# Patient Record
Sex: Female | Born: 1945 | Race: White | Hispanic: No | Marital: Married | Smoking: Never smoker
Health system: Southern US, Community
[De-identification: ages and names within clinical notes are randomized; demographics above are authoritative.]

## PROBLEM LIST (undated history)

## (undated) DIAGNOSIS — I639 Cerebral infarction, unspecified: Secondary | ICD-10-CM

---

## 2018-12-05 ENCOUNTER — Other Ambulatory Visit: Payer: Self-pay

## 2018-12-05 ENCOUNTER — Emergency Department (HOSPITAL_BASED_OUTPATIENT_CLINIC_OR_DEPARTMENT_OTHER): Payer: Medicare Other

## 2018-12-05 ENCOUNTER — Encounter (HOSPITAL_BASED_OUTPATIENT_CLINIC_OR_DEPARTMENT_OTHER): Payer: Self-pay | Admitting: Emergency Medicine

## 2018-12-05 ENCOUNTER — Inpatient Hospital Stay (HOSPITAL_BASED_OUTPATIENT_CLINIC_OR_DEPARTMENT_OTHER)
Admission: EM | Admit: 2018-12-05 | Discharge: 2018-12-07 | DRG: 065 | Disposition: A | Discharge status: Home / Self Care | Payer: Medicare Other | Attending: Internal Medicine | Admitting: Internal Medicine

## 2018-12-05 ENCOUNTER — Inpatient Hospital Stay (HOSPITAL_COMMUNITY): Payer: Medicare Other

## 2018-12-05 DIAGNOSIS — E119 Type 2 diabetes mellitus without complications: Secondary | ICD-10-CM

## 2018-12-05 DIAGNOSIS — E876 Hypokalemia: Secondary | ICD-10-CM | POA: Diagnosis present

## 2018-12-05 DIAGNOSIS — Z7982 Long term (current) use of aspirin: Secondary | ICD-10-CM | POA: Diagnosis not present

## 2018-12-05 DIAGNOSIS — I10 Essential (primary) hypertension: Secondary | ICD-10-CM | POA: Diagnosis present

## 2018-12-05 DIAGNOSIS — I672 Cerebral atherosclerosis: Secondary | ICD-10-CM | POA: Diagnosis present

## 2018-12-05 DIAGNOSIS — I160 Hypertensive urgency: Secondary | ICD-10-CM | POA: Diagnosis present

## 2018-12-05 DIAGNOSIS — E785 Hyperlipidemia, unspecified: Secondary | ICD-10-CM | POA: Diagnosis present

## 2018-12-05 DIAGNOSIS — R270 Ataxia, unspecified: Secondary | ICD-10-CM | POA: Diagnosis present

## 2018-12-05 DIAGNOSIS — N179 Acute kidney failure, unspecified: Secondary | ICD-10-CM | POA: Diagnosis present

## 2018-12-05 DIAGNOSIS — E1165 Type 2 diabetes mellitus with hyperglycemia: Secondary | ICD-10-CM | POA: Diagnosis present

## 2018-12-05 DIAGNOSIS — Z7902 Long term (current) use of antithrombotics/antiplatelets: Secondary | ICD-10-CM | POA: Diagnosis not present

## 2018-12-05 DIAGNOSIS — E0800 Diabetes mellitus due to underlying condition with hyperosmolarity without nonketotic hyperglycemic-hyperosmolar coma (NKHHC): Secondary | ICD-10-CM

## 2018-12-05 DIAGNOSIS — Z823 Family history of stroke: Secondary | ICD-10-CM | POA: Diagnosis not present

## 2018-12-05 DIAGNOSIS — I361 Nonrheumatic tricuspid (valve) insufficiency: Secondary | ICD-10-CM | POA: Diagnosis not present

## 2018-12-05 DIAGNOSIS — I639 Cerebral infarction, unspecified: Principal | ICD-10-CM | POA: Diagnosis present

## 2018-12-05 DIAGNOSIS — N19 Unspecified kidney failure: Secondary | ICD-10-CM | POA: Diagnosis not present

## 2018-12-05 DIAGNOSIS — G8191 Hemiplegia, unspecified affecting right dominant side: Secondary | ICD-10-CM | POA: Diagnosis present

## 2018-12-05 DIAGNOSIS — I161 Hypertensive emergency: Secondary | ICD-10-CM

## 2018-12-05 DIAGNOSIS — Z79899 Other long term (current) drug therapy: Secondary | ICD-10-CM | POA: Diagnosis not present

## 2018-12-05 DIAGNOSIS — I340 Nonrheumatic mitral (valve) insufficiency: Secondary | ICD-10-CM | POA: Diagnosis not present

## 2018-12-05 DIAGNOSIS — R9082 White matter disease, unspecified: Secondary | ICD-10-CM | POA: Diagnosis present

## 2018-12-05 HISTORY — DX: Cerebral infarction, unspecified: I63.9

## 2018-12-05 LAB — CBC
HCT: 42.1 % (ref 36.0–46.0)
Hemoglobin: 13.2 g/dL (ref 12.0–15.0)
MCH: 28.3 pg (ref 26.0–34.0)
MCHC: 31.4 g/dL (ref 30.0–36.0)
MCV: 90.1 fL (ref 80.0–100.0)
Platelets: 271 10*3/uL (ref 150–400)
RBC: 4.67 MIL/uL (ref 3.87–5.11)
RDW: 13.2 % (ref 11.5–15.5)
WBC: 7.4 10*3/uL (ref 4.0–10.5)
nRBC: 0 % (ref 0.0–0.2)

## 2018-12-05 LAB — COMPREHENSIVE METABOLIC PANEL
ALT: 16 U/L (ref 0–44)
AST: 19 U/L (ref 15–41)
Albumin: 4.4 g/dL (ref 3.5–5.0)
Alkaline Phosphatase: 71 U/L (ref 38–126)
Anion gap: 10 (ref 5–15)
BUN: 28 mg/dL — ABNORMAL HIGH (ref 8–23)
CHLORIDE: 107 mmol/L (ref 98–111)
CO2: 21 mmol/L — ABNORMAL LOW (ref 22–32)
Calcium: 9.7 mg/dL (ref 8.9–10.3)
Creatinine, Ser: 1.36 mg/dL — ABNORMAL HIGH (ref 0.44–1.00)
GFR calc Af Amer: 46 mL/min — ABNORMAL LOW (ref >60)
GFR calc non Af Amer: 39 mL/min — ABNORMAL LOW (ref >60)
Glucose, Bld: 182 mg/dL — ABNORMAL HIGH (ref 70–99)
POTASSIUM: 3.3 mmol/L — AB (ref 3.5–5.1)
Sodium: 138 mmol/L (ref 135–145)
Total Bilirubin: 0.7 mg/dL (ref 0.3–1.2)
Total Protein: 8.2 g/dL — ABNORMAL HIGH (ref 6.5–8.1)

## 2018-12-05 LAB — DIFFERENTIAL
Abs Immature Granulocytes: 0.01 10*3/uL (ref 0.00–0.07)
BASOS PCT: 0 %
Basophils Absolute: 0 10*3/uL (ref 0.0–0.1)
EOS ABS: 0 10*3/uL (ref 0.0–0.5)
EOS PCT: 0 %
Immature Granulocytes: 0 %
Lymphocytes Relative: 29 %
Lymphs Abs: 2.1 10*3/uL (ref 0.7–4.0)
Monocytes Absolute: 0.4 10*3/uL (ref 0.1–1.0)
Monocytes Relative: 5 %
Neutro Abs: 4.8 10*3/uL (ref 1.7–7.7)
Neutrophils Relative %: 66 %

## 2018-12-05 LAB — PROTIME-INR
INR: 1 (ref 0.8–1.2)
Prothrombin Time: 13.1 seconds (ref 11.4–15.2)

## 2018-12-05 LAB — APTT: aPTT: 28 seconds (ref 24–36)

## 2018-12-05 LAB — CBG MONITORING, ED: Glucose-Capillary: 146 mg/dL — ABNORMAL HIGH (ref 70–99)

## 2018-12-05 MED ORDER — SENNA 8.6 MG PO TABS
1.0000 | ORAL_TABLET | Freq: Two times a day (BID) | ORAL | Status: DC
  Administered 2018-12-05 – 2018-12-07 (×4): 8.6 mg via ORAL
  Filled 2018-12-05 (×4): qty 1

## 2018-12-05 MED ORDER — ASPIRIN 325 MG PO TABS
325.0000 mg | ORAL_TABLET | Freq: Once | ORAL | Status: AC
  Administered 2018-12-05: 325 mg via ORAL
  Filled 2018-12-05: qty 1

## 2018-12-05 MED ORDER — ASPIRIN EC 81 MG PO TBEC
81.0000 mg | DELAYED_RELEASE_TABLET | Freq: Every day | ORAL | Status: DC
  Administered 2018-12-06 – 2018-12-07 (×2): 81 mg via ORAL
  Filled 2018-12-05 (×3): qty 1

## 2018-12-05 MED ORDER — HEPARIN SODIUM (PORCINE) 5000 UNIT/ML IJ SOLN
5000.0000 [IU] | Freq: Three times a day (TID) | INTRAMUSCULAR | Status: DC
  Administered 2018-12-05 – 2018-12-07 (×6): 5000 [IU] via SUBCUTANEOUS
  Filled 2018-12-05 (×6): qty 1

## 2018-12-05 MED ORDER — ATORVASTATIN CALCIUM 80 MG PO TABS
80.0000 mg | ORAL_TABLET | Freq: Every day | ORAL | Status: DC
  Administered 2018-12-05 – 2018-12-06 (×2): 80 mg via ORAL
  Filled 2018-12-05 (×2): qty 1

## 2018-12-05 MED ORDER — LORAZEPAM 2 MG/ML IJ SOLN
0.5000 mg | Freq: Once | INTRAMUSCULAR | Status: AC | PRN
  Administered 2018-12-05: 0.5 mg via INTRAVENOUS
  Filled 2018-12-05: qty 1

## 2018-12-05 MED ORDER — POTASSIUM CHLORIDE CRYS ER 20 MEQ PO TBCR
40.0000 meq | EXTENDED_RELEASE_TABLET | Freq: Once | ORAL | Status: AC
  Administered 2018-12-05: 40 meq via ORAL
  Filled 2018-12-05: qty 2

## 2018-12-05 MED ORDER — IOPAMIDOL (ISOVUE-370) INJECTION 76%
100.0000 mL | Freq: Once | INTRAVENOUS | Status: AC | PRN
  Administered 2018-12-05: 80 mL via INTRAVENOUS

## 2018-12-05 MED ORDER — LABETALOL HCL 5 MG/ML IV SOLN
10.0000 mg | INTRAVENOUS | Status: DC | PRN
  Administered 2018-12-05 – 2018-12-07 (×5): 10 mg via INTRAVENOUS
  Filled 2018-12-05 (×6): qty 4

## 2018-12-05 MED ORDER — ACETAMINOPHEN 650 MG RE SUPP: 650.0000 mg | Freq: Four times a day (QID) | RECTAL | Status: DC | PRN

## 2018-12-05 MED ORDER — LABETALOL HCL 5 MG/ML IV SOLN
10.0000 mg | Freq: Once | INTRAVENOUS | Status: AC
  Administered 2018-12-05: 10 mg via INTRAVENOUS
  Filled 2018-12-05: qty 4

## 2018-12-05 MED ORDER — LORAZEPAM 2 MG/ML IJ SOLN
0.5000 mg | Freq: Once | INTRAMUSCULAR | Status: AC
  Administered 2018-12-05: 0.5 mg via INTRAVENOUS
  Filled 2018-12-05: qty 1

## 2018-12-05 MED ORDER — ACETAMINOPHEN 325 MG PO TABS: 650.0000 mg | ORAL_TABLET | Freq: Four times a day (QID) | ORAL | Status: DC | PRN

## 2018-12-05 MED ORDER — SODIUM CHLORIDE 0.9% FLUSH
3.0000 mL | Freq: Once | INTRAVENOUS | Status: DC
Start: 2018-12-05 — End: 2018-12-07
  Filled 2018-12-05: qty 3

## 2018-12-05 NOTE — ED Notes (Signed)
Pt states woke at 0444m this am w some numbness and tingling in hands and feet,  Then when she got up at 0600 had some dizziness, very anxious,  Denies pain  Last seen normal at 2200 last pm  Pt a/o,  No slurred speech,  No facial droop

## 2018-12-05 NOTE — H&P (Signed)
Date: 12/05/2018               Patient Name:  Christine Glass MRN: [id number]  DOB: 1946/02/29 Age / Sex: 73 y.o., female   PCP: Patient, No Pcp Per         Medical Service: Internal Medicine Teaching Service         Attending Physician: Dr. Sandre Kitty Elwin Mocha, MD    First Contact: Dr. Judeth Cornfield Pager: [phone]  Second Contact: Dr. Ginette Otto Pager: [phone]       After Hours (After 5p/  First Contact Pager: [phone]  weekends / holidays): Second Contact Pager: [phone]   Chief Complaint: Right sided numbness  History of Present Illness:  Christine Glass is a 73 yo F w/ no significant PMH of presenting with right upper extremity numbness and right lower extremity weakness. She was examined and evaluated at bedside with husband present. She was observed resting comfortably in bed. She states she was in her usual state of health until around 4am this morning when she woke up and noticed a numbness of her right arm described as 'dull feeling' as well as right foot weakness. She states intially she thought the sensation would pass but her symptoms persisted and after discussion with her husband she went to the [facility] ED for evaluation. She was told that she would be transferred to [facility]. Her right foot weakness has resolved but she is continuing to endorse numbness of her right arm. She is right-hand dominant and was able to use her right arm for signing signature on paperwork without difficulty. She denies any other numbness, tingling, burning, headache, blurry vision, dizziness or light-headedness. Denies any chest pain, palpitations, dyspnea, nausea, vomiting.  In the ED, she was found to have CT head wo contrast w/ hypoattenuation in left frontal lobe but no bleed. CTA head and neck was performed in the ED showing intracranial atherosclerosis without occlusion and age-indeterminate left thalamic infarct. Neurology was consulted who recommended admission to  [facility] with MRI. IM was consulted for admission.  Meds:  Current Meds  Medication Sig  . aspirin 81 MG chewable tablet Chew 81 mg by mouth daily.   Allergies: Allergies as of 12/05/2018  . (No Known Allergies)   History reviewed. No pertinent past medical history.  Family History:  Grandfather had stroke in his 22s. No other significant family history that she is aware of.  Social History:  Used to work in office as HR rep. Children lives close by. Currently living with husband. Denies any alcohol, tobacco, illicit substance use.  Review of Systems: A complete ROS was negative except as per HPI.   Physical Exam: Blood pressure (!) 205/126, pulse 92, temperature 98.3 F (36.8 C), temperature source Oral, resp. rate 17, height 5\' 1"  (1.549 m), weight 77.1 kg, SpO2 99 %. Physical Exam  Constitutional: She is well-developed, well-nourished, and in no distress. No distress.  HENT:  Head: Normocephalic and atraumatic.  Mouth/Throat: Oropharynx is clear and moist.  Eyes: Pupils are equal, round, and reactive to light. Conjunctivae and EOM are normal.  Neck: Normal range of motion. Neck supple. No tracheal deviation present. No thyromegaly present.  Cardiovascular: Regular rhythm, normal heart sounds and intact distal pulses.  No murmur heard. Tachycardic  Pulmonary/Chest: Effort normal and breath sounds normal. She has no wheezes. She has no rales.  Abdominal: Soft. Bowel sounds are normal. There is no abdominal tenderness. There is no guarding.  Musculoskeletal:  Normal range of motion.        General: No edema.  Neurological:  Neurologic exam: Mental status: A&Ox3 Cranial Nerves: II: PERRL III, IV, VI: Extra-occular motions intact bilaterally V, VII: Face symmetric, sensation intact in all 3 divisions  VIII: hearing normal to rubbing fingers bilaterally  IX, X: palate rises symmetrically XI: Head  turn and shoulder shrug normal bilaterally  XII: tongue midline  Motor: Strength 4/5 on RUE, 5/5 on RLE, 5/5 on LUE, 5/5 on LLE, bulk muscle and tone are normal  Sensory: Light touch intact and symmetric bilaterally but endorsing subjective numbness of right upper extremity.  Coordination: There is no dysmetria on finger-to-nose. Rapid alternating movement test normal. Psychiatric: Normal mood and affect  Skin: She is not diaphoretic.   EKG: personally reviewed my interpretation is sinus tachycardia, normal axis, no ST changes, prolonged QTc.  CXR: personally reviewed my interpretation is N/A  Assessment & Plan by Problem: Active Problems:   Acute CVA (cerebrovascular accident) [facility])  Christine Glass is a 73 yo F w/ no significant PMH presenting with right sided numbness most likely 2/2 CVA. MRI is pending for confirmation on chronicity of left thalamic infract found on CTA. Neurology on board. Admit for stroke work up.  Right sided numbness/weakness 2/2 CVA CT head wo contrast without hemorrhage or mass. CTA show no emergent large vessel occlusion. Intracranial atherosclerosis involving PCAs. Left thalamic infarct of indeterminate age. MRI pending - Allow for permissive HTN in the setting of CVA (systolic < 220 and diastolic < 120)   - ASA 325 mg once/ 81 mg daily  - Atorvastatin 80mg  - Echocardiogram  - A1C, Lipid panel  - Telemetry - PT/OT/SLP eval and treat  Severe uncontrolled hypertension BP on admission 240/130. Consistently high since admission. Current bp 220/132. HR 97. Not endorsing headache, chest pain, dyspnea, blurry vision. - Allowing permissive hypertension up to 220 systolic in setting of stroke - Labetalol 10mg  q2hr PRN for systolic BP >220, diastolic >120  Acute Kidney Injury Admit creatinine 1.36. Unclear baseline. Most likely 2/2 dehydration from no significant fluid intake since last night. - Trend BMP  Hypokalemia Admit potassium 3.3. Not on any  potassium wasting meds. - Magnesium level - K-dur once after passing speech and swallow  DVT prophx: subqheparin Diet: Npo til speech and swallow Bowel: Senokot Code: Full  Dispo: Admit patient to Inpatient with expected length of stay greater than 2 midnights.  Signed: Theotis Barrio, MD 12/05/2018, 12:39 PM  Pager: [phone]

## 2018-12-05 NOTE — Consult Note (Addendum)
Neurology Consultation  Reason for Consult: Right-sided numbness Referring Physician: Dr. Juleen China  CC: Right-sided numbness and weakness  History is obtained from: Patient, chart, patient's husband at bedside  HPI: Christine Glass is a 73 y.o. female with no significant past medical history as she has not seen a doctor in many years, presents to the emergency room for evaluation for strokelike symptoms. She initially presented to the emergency department at [facility] complaining of numbness in the right arm and leg when she woke up this morning at 4 AM.  Her last known normal was 9 PM last night when she went to bed. She denies any headaches or visual symptoms.  Reports right arm and leg numbness and heaviness. Denies any frank weakness.  Has not had similar symptoms in the past.  Currently on no medications. Denies any preceding nausea vomiting.  Denies any fevers chills.  Denies any chest pain palpitations.  Denies shortness of breath cough.  Denies abdominal pain nausea vomiting.  Denies bleeding or clotting tendencies.  Noncontrast head CT at [facility] unremarkable. I received a call about this patient for med [facility] for recommendations as she was outside the window for any acute intervention and telemedicine neurology was not called.  I recommended a CTA head and neck and transferred to [facility] for MRI and further evaluation. CTA head and neck unremarkable for LVO Of note, she was extremely hypertensive on arrival with systolic blood pressures as high as high 250s.  LKW: 9 PM on 12/04/2018 tpa given?: no, outside the window Premorbid modified Rankin scale (mRS): 0  ROS: ROS was performed and is negative except as noted in the HPI.   History reviewed. No pertinent past medical history. None  No family history on file. None  Social History:   reports that she has never smoked. She has never used smokeless tobacco. She reports that she  does not drink alcohol or use drugs.  Does not abuse alcohol.  No tobacco use.  No illicit drug use.  Medications  Current Facility-Administered Medications:  .  sodium chloride flush (NS) 0.9 % injection 3 mL, 3 mL, Intravenous, Once, Tilden Fossa, MD  Current Outpatient Medications:  .  aspirin 81 MG chewable tablet, Chew 81 mg by mouth daily., Disp: , Rfl:   Exam: Current vital signs: BP (!) 230/99   Pulse 97   Temp 98.3 F (36.8 C) (Oral)   Resp 16   Ht 5\' 1"  (1.549 m)   Wt 77.1 kg   SpO2 97%   BMI 32.12 kg/m  Vital signs in last 24 hours: Temp:  [98.3 F (36.8 C)] 98.3 F (36.8 C) (02/26 0801) Pulse Rate:  [76-121] 97 (02/26 1015) Resp:  [16-24] 16 (02/26 1015) BP: (201-252)/(99-217) 230/99 (02/26 1005) SpO2:  [85 %-100 %] 97 % (02/26 1015) Weight:  [77.1 kg] 77.1 kg (02/26 0755) General: Awake alert in no distress sitting comfortably in bed. HEENT: Normocephalic atraumatic dry oral mucous membranes with no lymphadenopathy and the neck and no thyromegaly. CVS: Regular rate rhythm Respiratory: Breathing comfortably and saturating normally on room air. Extremities: Warm well perfused no edema Neurological exam She is awake alert oriented x3. Her speech is not dysarthric. Naming comprehension and repetition is intact. Cranial nerves: Pupils equal round reactive to light, extraocular movements intact, visual fields full, facial sensation intact to light touch bilaterally, face is symmetric, auditory acuity is grossly intact, shoulder shrug intact, tongue midline.  Palate midline. Motor  exam no vertical drift noted on any of the 4 extremities but she has a mild pronator drift on the right arm.  Individual muscle testing noted very slight weakness of the right upper extremity 4+/5 at the biceps.  Lower extremities antigravity without drift for 5 seconds.  No focal weakness in the lower extremities. Sensory exam: Decreased light touch on the right arm and leg compared to  the left. Coordination: Mild ataxia on right finger-nose-finger discordant with the weakness.  No ataxia in the lower limbs.  No ataxia on the left arm. Gait testing was deferred at this time. NIH stroke scale- 2 (1 for sensory, 1 for ataxia)  Labs I have reviewed labs in epic and the results pertinent to this consultation are:  CBC    Component Value Date/Time   WBC 7.4 12/05/2018 0817   RBC 4.67 12/05/2018 0817   HGB 13.2 12/05/2018 0817   HCT 42.1 12/05/2018 0817   PLT 271 12/05/2018 0817   MCV 90.1 12/05/2018 0817   MCH 28.3 12/05/2018 0817   MCHC 31.4 12/05/2018 0817   RDW 13.2 12/05/2018 0817   LYMPHSABS 2.1 12/05/2018 0817   MONOABS 0.4 12/05/2018 0817   EOSABS 0.0 12/05/2018 0817   BASOSABS 0.0 12/05/2018 0817    CMP     Component Value Date/Time   NA 138 12/05/2018 0817   K 3.3 (L) 12/05/2018 0817   CL 107 12/05/2018 0817   CO2 21 (L) 12/05/2018 0817   GLUCOSE 182 (H) 12/05/2018 0817   BUN 28 (H) 12/05/2018 0817   CREATININE 1.36 (H) 12/05/2018 0817   CALCIUM 9.7 12/05/2018 0817   PROT 8.2 (H) 12/05/2018 0817   ALBUMIN 4.4 12/05/2018 0817   AST 19 12/05/2018 0817   ALT 16 12/05/2018 0817   ALKPHOS 71 12/05/2018 0817   BILITOT 0.7 12/05/2018 0817   GFRNONAA 39 (L) 12/05/2018 0817   GFRAA 46 (L) 12/05/2018 0817    Imaging I have reviewed the images obtained:  CT-scan of the brain-noncontrast CT of the head is unremarkable for any acute process.  Extensive chronic white matter disease. CTA head and neck- no emergent LVO.  No hemodynamically significant stenosis. No  Assessment:  73 year old woman with no significant past medical history, has not seen a physician in many years, presenting for evaluation of right-sided numbness and weakness. On examination, she has mild right upper extremity ataxia and very mild right hemiparesis along with decreased sensation on the right. I suspect she might of suffered from a small vessel etiology stroke given her risk  factors. Other possibilities include hypertensive emergency causing the symptoms.  Impression: Evaluate for stroke versus TIA Evaluate for hypertensive emergency  Recommendations: Admit to hospitalist Systolic blood pressure goal less than 220 424 to 48 hours.  After that it should be reduced to a goal blood pressure of less than 140/90 on discharge. MRI brain without contrast No further vascular imaging is indicated at this time-he already had a CTA done. Aspirin 325 Atorvastatin 80 2D echocardiogram Telemetry Fasting lipid panel Hemoglobin A1c Physical therapy Occupational Therapy Speech therapy N.p.o. until cleared by bedside swallow evaluation or formal swallow evaluation. I discussed the importance of regular follow-up with primary care physician.  She has a primary care that she used to see many years ago and would like to go back to that provider.  Primary team to provide referrals.  I have answered all the questions that the patient and her husband had to the best of my ability.  I have relayed the plan to the ED provider.  Stroke team will follow with you from tomorrow the results and final recommendations.   -- Milon Dikes, MD [facility] Pager: [phone] If 7pm to 7am, please call on call as listed on AMION.

## 2018-12-05 NOTE — Progress Notes (Signed)
SLP Cancellation Note  Patient Details Name: Christine Glass MRN: [id number] DOB: 1946/04/16   Cancelled treatment:       Reason Eval/Treat Not Completed: Patient at procedure or test/unavailable   Virl Axe Christiaan Strebeck 12/05/2018, 2:13 PM  Ivar Drape, M.A. CCC-SLP Acute Herbalist [phone] Office [phone]

## 2018-12-05 NOTE — ED Provider Notes (Signed)
[facility] EMERGENCY DEPARTMENT Provider Note   CSN: [id number] Arrival date & time: 12/05/18  2836    History   Chief Complaint Chief Complaint  Patient presents with  . Numbness    HPI Christine Glass is a 73 y.o. female.     The history is provided by the patient and the spouse. No language interpreter was used.   Christine Glass is a 74 y.o. female who presents to the Emergency Department complaining of numbness. She presents to the emergency department complaining of numbness that began when she awoke at 4 AM this morning. She was last known well between 9 and 10 PM last night when she went to bed. When she awoke at four she stated that she was having numbness in her right arm and right leg with some mild associated weakness. She denies any vision changes, headache, chest pain, vomiting. No prior similar symptoms. She has no known medical problems and takes no medications. She does not smoke, drink alcohol or use drugs. She is accompanied by her husband. History reviewed. No pertinent past medical history. no pertinent past medical history. Patient Active Problem List   Diagnosis Date Noted  . Acute CVA (cerebrovascular accident) (HCC) 12/05/2018       OB History   No obstetric history on file.      Home Medications    Prior to Admission medications   Medication Sig Start Date End Date Taking? Authorizing Provider  aspirin 81 MG chewable tablet Chew 81 mg by mouth daily.   Yes [provider]    Family History No family history on file.  Social History Social History   Tobacco Use  . Smoking status: Never Smoker  . Smokeless tobacco: Never Used  Substance Use Topics  . Alcohol use: Never    Frequency: Never  . Drug use: Never     Allergies   Patient has no known allergies.   Review of Systems Review of Systems  All other systems reviewed and are negative.    Physical Exam Updated Vital Signs BP (!) 200/127 (BP Location: Left  Arm)   Pulse (!) 105   Temp 98.5 F (36.9 C) (Oral)   Resp 20   Ht 5\' 1"  (1.549 m)   Wt 77.1 kg   SpO2 97%   BMI 32.12 kg/m   Physical Exam Vitals signs and nursing note reviewed.  Constitutional:      Appearance: She is well-developed.  HENT:     Head: Normocephalic and atraumatic.  Eyes:     Pupils: Pupils are equal, round, and reactive to light.  Cardiovascular:     Rate and Rhythm: Regular rhythm.     Heart sounds: No murmur.     Comments: Tachycardic Pulmonary:     Effort: Pulmonary effort is normal. No respiratory distress.     Breath sounds: Normal breath sounds.  Abdominal:     Palpations: Abdomen is soft.     Tenderness: There is no abdominal tenderness. There is no guarding or rebound.  Musculoskeletal:        General: No tenderness.  Skin:    General: Skin is warm and dry.     Comments: Cranial nerves are grossly intact. Visual fields are grossly intact. There is mild right sided pronator drift. Altered sensation to light touch to the right arm, right leg. Five out of five grip strength bilaterally. Five out of five strength and bilateral lower extremities.  Neurological:     Mental Status: She  is alert and oriented to person, place, and time.  Psychiatric:        Behavior: Behavior normal.      ED Treatments / Results  Labs (all labs ordered are listed, but only abnormal results are displayed) Labs Reviewed  COMPREHENSIVE METABOLIC PANEL - Abnormal; Notable for the following components:      Result Value   Potassium 3.3 (*)    CO2 21 (*)    Glucose, Bld 182 (*)    BUN 28 (*)    Creatinine, Ser 1.36 (*)    Total Protein 8.2 (*)    GFR calc non Af Amer 39 (*)    GFR calc Af Amer 46 (*)    All other components within normal limits  CBG MONITORING, ED - Abnormal; Notable for the following components:   Glucose-Capillary 146 (*)    All other components within normal limits  PROTIME-INR  APTT  CBC  DIFFERENTIAL  HIV ANTIBODY (ROUTINE TESTING W  REFLEX)    EKG EKG Interpretation  Date/Time:  Wednesday December 05 2018 07:45:34 EST Ventricular Rate:  125 PR Interval:    QRS Duration: 91 QT Interval:  339 QTC Calculation: 489 R Axis:   52 Text Interpretation:  Sinus tachycardia Borderline ST depression, diffuse leads Borderline prolonged QT interval no prior available for comparison Confirmed by Tilden Fossa [phone]) on 12/05/2018 7:58:37 AM Also confirmed by Tilden Fossa [phone]), editor Barbette Hair [phone])  on 12/05/2018 8:01:36 AM   Radiology Ct Angio Head W Or Wo Contrast  Addendum Date: 12/05/2018   ADDENDUM REPORT: 12/05/2018 11:10 ADDENDUM: Preliminary finding of no large vessel occlusion was communicated to Dr. Wilford Corner via telephone on 12/05/2018 at 10:15 a.m. Electronically Signed   By: Sebastian Ache M.D.   On: 12/05/2018 11:10   Result Date: 12/05/2018 CLINICAL DATA:  Right arm and leg weakness and numbness. EXAM: CT ANGIOGRAPHY HEAD AND NECK TECHNIQUE: Multidetector CT imaging of the head and neck was performed using the standard protocol during bolus administration of intravenous contrast. Multiplanar CT image reconstructions and MIPs were obtained to evaluate the vascular anatomy. Carotid stenosis measurements (when applicable) are obtained utilizing NASCET criteria, using the distal internal carotid diameter as the denominator. CONTRAST:  80mL ISOVUE-370 IOPAMIDOL (ISOVUE-370) INJECTION 76% COMPARISON:  None. FINDINGS: CTA NECK FINDINGS Aortic arch: Standard 3 vessel aortic arch. Widely patent arch vessel origins. Right carotid system: Patent without evidence of stenosis or dissection. Left carotid system: Patent without evidence of stenosis or dissection. Vertebral arteries: Patent without evidence of stenosis or dissection. Mildly to moderately dominant left vertebral artery. Skeleton: Multiple dental caries. Extensive periapical lucency associated with a right mandibular molar tooth. Mild-to-moderate cervical disc  degeneration. Other neck: No evidence of acute abnormality or mass. Upper chest: Clear lung apices. Review of the MIP images confirms the above findings CTA HEAD FINDINGS Anterior circulation: The internal carotid arteries are widely patent from skull base to carotid termini. ACAs and MCAs are patent without evidence of proximal branch occlusion or significant proximal stenosis. The right A1 segment is hypoplastic. Anterior communicating arteries is unremarkable. No aneurysm is identified. Posterior circulation: The intracranial vertebral arteries are widely patent to the basilar. Patent PICA, AICA, and SCA origins are identified bilaterally. The basilar artery is widely patent. Posterior communicating arteries are not clearly identified and may be diminutive or absent. PCAs are patent with a moderate proximal P2 stenosis on the left and with severe distal branch vessel stenoses bilaterally. No aneurysm is identified. Venous sinuses: Patent.  Anatomic variants: Hypoplastic right A1. Delayed phase: No abnormal enhancement. 1.2 cm infarct involving the lateral left thalamus and posterior limb of internal capsule. Review of the MIP images confirms the above findings IMPRESSION: 1. No emergent large vessel occlusion. 2. Intracranial atherosclerosis most notably involving the PCAs. Moderate left P2 and severe bilateral distal PCA branch vessel stenoses. 3. Widely patent carotid and vertebral arteries. 4. Left thalamic infarct, possibly acute or subacute. Electronically Signed: By: Sebastian Ache M.D. On: 12/05/2018 11:00   Ct Head Wo Contrast  Result Date: 12/05/2018 CLINICAL DATA:  Right hand and right foot numbness. EXAM: CT HEAD WITHOUT CONTRAST TECHNIQUE: Contiguous axial images were obtained from the base of the skull through the vertex without intravenous contrast. COMPARISON:  None. FINDINGS: Brain: No evidence of acute hemorrhage, hydrocephalus, extra-axial collection or mass lesion/mass effect. Moderate deep  white matter microangiopathy. Areas of hypoattenuation in the subcortical white matter of the left frontal lobe slightly more pronounced. Vascular: No hyperdense vessel or unexpected calcification. Skull: Normal. Negative for fracture or focal lesion. Sinuses/Orbits: No acute finding. Other: None. IMPRESSION: 1. Moderate deep white matter microangiopathy. 2. Slightly more pronounced patchy areas of hypoattenuation in the subcortical white matter of the left frontal lobe may represent asymmetric microangiopathy or potentially age-indeterminate ischemic infarcts. 3. No evidence of intracranial hemorrhage. Electronically Signed   By: Ted Mcalpine M.D.   On: 12/05/2018 08:29   Ct Angio Neck W And/or Wo Contrast  Addendum Date: 12/05/2018   ADDENDUM REPORT: 12/05/2018 11:10 ADDENDUM: Preliminary finding of no large vessel occlusion was communicated to Dr. Wilford Corner via telephone on 12/05/2018 at 10:15 a.m. Electronically Signed   By: Sebastian Ache M.D.   On: 12/05/2018 11:10   Result Date: 12/05/2018 CLINICAL DATA:  Right arm and leg weakness and numbness. EXAM: CT ANGIOGRAPHY HEAD AND NECK TECHNIQUE: Multidetector CT imaging of the head and neck was performed using the standard protocol during bolus administration of intravenous contrast. Multiplanar CT image reconstructions and MIPs were obtained to evaluate the vascular anatomy. Carotid stenosis measurements (when applicable) are obtained utilizing NASCET criteria, using the distal internal carotid diameter as the denominator. CONTRAST:  53mL ISOVUE-370 IOPAMIDOL (ISOVUE-370) INJECTION 76% COMPARISON:  None. FINDINGS: CTA NECK FINDINGS Aortic arch: Standard 3 vessel aortic arch. Widely patent arch vessel origins. Right carotid system: Patent without evidence of stenosis or dissection. Left carotid system: Patent without evidence of stenosis or dissection. Vertebral arteries: Patent without evidence of stenosis or dissection. Mildly to moderately dominant left  vertebral artery. Skeleton: Multiple dental caries. Extensive periapical lucency associated with a right mandibular molar tooth. Mild-to-moderate cervical disc degeneration. Other neck: No evidence of acute abnormality or mass. Upper chest: Clear lung apices. Review of the MIP images confirms the above findings CTA HEAD FINDINGS Anterior circulation: The internal carotid arteries are widely patent from skull base to carotid termini. ACAs and MCAs are patent without evidence of proximal branch occlusion or significant proximal stenosis. The right A1 segment is hypoplastic. Anterior communicating arteries is unremarkable. No aneurysm is identified. Posterior circulation: The intracranial vertebral arteries are widely patent to the basilar. Patent PICA, AICA, and SCA origins are identified bilaterally. The basilar artery is widely patent. Posterior communicating arteries are not clearly identified and may be diminutive or absent. PCAs are patent with a moderate proximal P2 stenosis on the left and with severe distal branch vessel stenoses bilaterally. No aneurysm is identified. Venous sinuses: Patent. Anatomic variants: Hypoplastic right A1. Delayed phase: No abnormal enhancement. 1.2 cm infarct  involving the lateral left thalamus and posterior limb of internal capsule. Review of the MIP images confirms the above findings IMPRESSION: 1. No emergent large vessel occlusion. 2. Intracranial atherosclerosis most notably involving the PCAs. Moderate left P2 and severe bilateral distal PCA branch vessel stenoses. 3. Widely patent carotid and vertebral arteries. 4. Left thalamic infarct, possibly acute or subacute. Electronically Signed: By: Sebastian Ache M.D. On: 12/05/2018 11:00   Mr Brain Wo Contrast  Result Date: 12/05/2018 CLINICAL DATA:  New onset of numbness in the right upper and lower extremity upon waking up this morning at 4 a.m. Last known normal at 9 p.m. last night before going to bed. EXAM: MRI HEAD WITHOUT  CONTRAST TECHNIQUE: Multiplanar, multiecho pulse sequences of the brain and surrounding structures were obtained without intravenous contrast. COMPARISON:  CTA head and neck 12/05/2017. FINDINGS: Brain: Diffusion-weighted images confirm an acute nonhemorrhagic infarct involving the left thalamus measuring up to 13 mm. T2 signal changes are associated with the infarct, suggesting the infarct is at least several hours old. Moderate generalized atrophy and white matter disease is present otherwise. Confluent periventricular and scattered subcortical T2 hyperintensities are noted bilaterally. White matter changes extend into the brainstem. No acute hemorrhage or mass lesion is present. The internal auditory canals are within normal limits. Vascular: Flow is present in the major intracranial arteries. Skull and upper cervical spine: The craniocervical junction is normal. Upper cervical spine is within normal limits. Marrow signal is unremarkable. Sinuses/Orbits: The paranasal sinuses and mastoid air cells are clear. The globes and orbits are within normal limits. IMPRESSION: 1. Acute/subacute nonhemorrhagic left thalamic infarct measures up to 13 mm. This corresponds with the patient's new onset of right-sided symptoms. 2. Moderate diffuse periventricular and subcortical white matter disease and atrophy likely reflects the sequela of chronic microvascular ischemia. Electronically Signed   By: Marin Roberts M.D.   On: 12/05/2018 14:47    Procedures Procedures (including critical care time) CRITICAL CARE Performed by: Tilden Fossa   Total critical care time: 40 minutes  Critical care time was exclusive of separately billable procedures and treating other patients.  Critical care was necessary to treat or prevent imminent or life-threatening deterioration.  Critical care was time spent personally by me on the following activities: development of treatment plan with patient and/or surrogate as well as  nursing, discussions with consultants, evaluation of patient's response to treatment, examination of patient, obtaining history from patient or surrogate, ordering and performing treatments and interventions, ordering and review of laboratory studies, ordering and review of radiographic studies, pulse oximetry and re-evaluation of patient's condition.  Medications Ordered in ED Medications  sodium chloride flush (NS) 0.9 % injection 3 mL (3 mLs Intravenous Not Given 12/05/18 0828)  aspirin tablet 325 mg (has no administration in time range)    Followed by  aspirin EC tablet 81 mg (has no administration in time range)  atorvastatin (LIPITOR) tablet 80 mg (has no administration in time range)  potassium chloride SA (K-DUR,KLOR-CON) CR tablet 40 mEq (has no administration in time range)  labetalol (NORMODYNE,TRANDATE) injection 10 mg (has no administration in time range)  heparin injection 5,000 Units (has no administration in time range)  acetaminophen (TYLENOL) tablet 650 mg (has no administration in time range)    Or  acetaminophen (TYLENOL) suppository 650 mg (has no administration in time range)  senna (SENOKOT) tablet 8.6 mg (has no administration in time range)  LORazepam (ATIVAN) injection 0.5 mg (0.5 mg Intravenous Given 12/05/18 0826)  labetalol (NORMODYNE,TRANDATE)  injection 10 mg (10 mg Intravenous Given 12/05/18 0850)  iopamidol (ISOVUE-370) 76 % injection 100 mL (80 mLs Intravenous Contrast Given 12/05/18 0911)  labetalol (NORMODYNE,TRANDATE) injection 10 mg (10 mg Intravenous Given 12/05/18 1030)  LORazepam (ATIVAN) injection 0.5 mg (0.5 mg Intravenous Given 12/05/18 1351)     Initial Impression / Assessment and Plan / ED Course  I have reviewed the triage vital signs and the nursing notes.  Pertinent labs & imaging results that were available during my care of the patient were reviewed by me and considered in my medical decision making (see chart for details).       Patient  presents to the emergency department for evaluation of new right upper extremity right lower extremity numbness. She does have weakness and decreased sensation on examination. She is not a code stroke/TPA candidate given duration of symptoms. She is also noted to be markedly tachycardic and hypertensive on ED arrival, patient states she is anxious and has white coat hypertension, initially treated with benzodiazepines for her anxiety with moderate improvement in tachycardia but persistent hypertension.   D/w Dr. Wilford CornerArora - recommends CTA head/neck, ED to ED transfer for urgent MRI, BP control to 220 systolic with permissive hypertension.    [facility] in [facility].    Patient and husband updated of findings of studies and recommendation for transfer and admission and they are in agreement with treatment plan.    Final Clinical Impressions(s) / ED Diagnoses   Final diagnoses:  None    ED Discharge Orders    None       Tilden Fossaees, Jonathen Rathman, MD 12/05/18 [phone]

## 2018-12-05 NOTE — Progress Notes (Signed)
MEWS: VS for 2025. BP elevated but it is under the parameter for PRN labatolol. Will continue to monitor VS Q 2hrs

## 2018-12-05 NOTE — ED Notes (Signed)
Called [facility], s/w Doug - requested emergency transport to [facility] ED.  Was advised that they had no trucks available and to call EMS.  Called [facility] and s/w Annabelle Harman - they are sending truck asap

## 2018-12-05 NOTE — ED Notes (Signed)
Received report from [facility]. Pt currently in room. A&O x4. Pt states right hand and right foot is still numb.

## 2018-12-05 NOTE — Progress Notes (Signed)
PT Cancellation Note  Patient Details Name: Christine Glass MRN: [id number] DOB: 02/27/1946   Cancelled Treatment:    Reason Eval/Treat Not Completed: Patient at procedure or test/unavailable. PT will continue to follow acutely as available.    Alessandra Bevels Min Tunnell 12/05/2018, 2:56 PM

## 2018-12-05 NOTE — ED Triage Notes (Signed)
Pt having right hand and right foot numbness that she started having at 0400.  Last known normal 10 pm last night.  Dr. Madilyn Hook notified

## 2018-12-05 NOTE — ED Notes (Signed)
Report called to [facility]

## 2018-12-05 NOTE — ED Notes (Signed)
To [facility] by [facility]

## 2018-12-05 NOTE — ED Notes (Signed)
ED TO INPATIENT HANDOFF REPORT  ED Nurse Name and Phone #: Eual Fines [phone]  S Name/Age/Gender Christine Glass 73 y.o. female Room/Bed: 018C/018C  Code Status   Code Status: Not on file  Home/SNF/Other Home Patient oriented to: self, place, time and situation Is this baseline? Yes   Triage Complete: Triage complete  Chief Complaint RIGHT SIDE HAND AND FOOT NUMBNESS  Triage Note Pt having right hand and right foot numbness that she started having at 0400.  Last known normal 10 pm last night.  Dr. Madilyn Hook notified   Allergies No Known Allergies  Level of Care/Admitting Diagnosis ED Disposition    ED Disposition Condition Comment   Admit  Hospital Area: [facility] [[id number]]  Level of Care: Medical Telemetry [104]  Diagnosis: Acute CVA (cerebrovascular accident) [facility]) [[id number]]  Admitting Physician: Anne Shutter [[id number]]  Attending Physician: Anne Shutter [[id number]]  Estimated length of stay: 3 - 4 days  Certification:: I certify this patient will need inpatient services for at least 2 midnights  PT Class (Do Not Modify): Inpatient [101]  PT Acc Code (Do Not Modify): Private [1]       B Medical/Surgery History History reviewed. No pertinent past medical history.    A IV Location/Drains/Wounds Patient Lines/Drains/Airways Status   Active Line/Drains/Airways    Name:   Placement date:   Placement time:   Site:   Days:   Peripheral IV 12/05/18 Right Antecubital   12/05/18    0820    Antecubital   less than 1          Intake/Output Last 24 hours No intake or output data in the 24 hours ending 12/05/18 1213  Labs/Imaging Results for orders placed or performed during the hospital encounter of 12/05/18 (from the past 48 hour(s))  CBG monitoring, ED     Status: Abnormal   Collection Time: 12/05/18  7:52 AM  Result Value Ref Range   Glucose-Capillary 146 (H) 70 - 99 mg/dL  Protime-INR     Status: None   Collection Time: 12/05/18   8:17 AM  Result Value Ref Range   Prothrombin Time 13.1 11.4 - 15.2 seconds   INR 1.0 0.8 - 1.2    Comment: (NOTE) INR goal varies based on device and disease states. Performed at [facility], [address] [place], [place] [address]   APTT     Status: None   Collection Time: 12/05/18  8:17 AM  Result Value Ref Range   aPTT 28 24 - 36 seconds    Comment: Performed at [facility], [address] [place], [place] [address]  CBC     Status: None   Collection Time: 12/05/18  8:17 AM  Result Value Ref Range   WBC 7.4 4.0 - 10.5 K/uL   RBC 4.67 3.87 - 5.11 MIL/uL   Hemoglobin 13.2 12.0 - 15.0 g/dL   HCT 95.2 84.1 - 32.4 %   MCV 90.1 80.0 - 100.0 fL   MCH 28.3 26.0 - 34.0 pg   MCHC 31.4 30.0 - 36.0 g/dL   RDW 40.1 02.7 - 25.3 %   Platelets 271 150 - 400 K/uL   nRBC 0.0 0.0 - 0.2 %    Comment: Performed at [facility], [address] [place], [place] [address]  Differential     Status: None   Collection Time: 12/05/18  8:17 AM  Result Value Ref Range   Neutrophils Relative %  66 %   Neutro Abs 4.8 1.7 - 7.7 K/uL   Lymphocytes Relative 29 %   Lymphs Abs 2.1 0.7 - 4.0 K/uL   Monocytes Relative 5 %   Monocytes Absolute 0.4 0.1 - 1.0 K/uL   Eosinophils Relative 0 %   Eosinophils Absolute 0.0 0.0 - 0.5 K/uL   Basophils Relative 0 %   Basophils Absolute 0.0 0.0 - 0.1 K/uL   Immature Granulocytes 0 %   Abs Immature Granulocytes 0.01 0.00 - 0.07 K/uL    Comment: Performed at [facility], [address] [place], [place] [address]  Comprehensive metabolic panel     Status: Abnormal   Collection Time: 12/05/18  8:17 AM  Result Value Ref Range   Sodium 138 135 - 145 mmol/L   Potassium 3.3 (L) 3.5 - 5.1 mmol/L   Chloride 107 98 - 111 mmol/L   CO2 21 (L) 22 - 32 mmol/L   Glucose, Bld 182 (H) 70 - 99 mg/dL   BUN 28 (H) 8 - 23 mg/dL   Creatinine, Ser 6.04 (H) 0.44 - 1.00 mg/dL   Calcium 9.7 8.9 - 54.0 mg/dL   Total Protein 8.2 (H) 6.5  - 8.1 g/dL   Albumin 4.4 3.5 - 5.0 g/dL   AST 19 15 - 41 U/L   ALT 16 0 - 44 U/L   Alkaline Phosphatase 71 38 - 126 U/L   Total Bilirubin 0.7 0.3 - 1.2 mg/dL   GFR calc non Af Amer 39 (L) >60 mL/min   GFR calc Af Amer 46 (L) >60 mL/min   Anion gap 10 5 - 15    Comment: Performed at [facility], [address] [place], [place] [address]   Ct Angio Head W Or Wo Contrast  Addendum Date: 12/05/2018   ADDENDUM REPORT: 12/05/2018 11:10 ADDENDUM: Preliminary finding of no large vessel occlusion was communicated to Dr. Wilford Corner via telephone on 12/05/2018 at 10:15 a.m. Electronically Signed   By: Sebastian Ache M.D.   On: 12/05/2018 11:10   Result Date: 12/05/2018 CLINICAL DATA:  Right arm and leg weakness and numbness. EXAM: CT ANGIOGRAPHY HEAD AND NECK TECHNIQUE: Multidetector CT imaging of the head and neck was performed using the standard protocol during bolus administration of intravenous contrast. Multiplanar CT image reconstructions and MIPs were obtained to evaluate the vascular anatomy. Carotid stenosis measurements (when applicable) are obtained utilizing NASCET criteria, using the distal internal carotid diameter as the denominator. CONTRAST:  80mL ISOVUE-370 IOPAMIDOL (ISOVUE-370) INJECTION 76% COMPARISON:  None. FINDINGS: CTA NECK FINDINGS Aortic arch: Standard 3 vessel aortic arch. Widely patent arch vessel origins. Right carotid system: Patent without evidence of stenosis or dissection. Left carotid system: Patent without evidence of stenosis or dissection. Vertebral arteries: Patent without evidence of stenosis or dissection. Mildly to moderately dominant left vertebral artery. Skeleton: Multiple dental caries. Extensive periapical lucency associated with a right mandibular molar tooth. Mild-to-moderate cervical disc degeneration. Other neck: No evidence of acute abnormality or mass. Upper chest: Clear lung apices. Review of the MIP images confirms the above findings CTA HEAD FINDINGS  Anterior circulation: The internal carotid arteries are widely patent from skull base to carotid termini. ACAs and MCAs are patent without evidence of proximal branch occlusion or significant proximal stenosis. The right A1 segment is hypoplastic. Anterior communicating arteries is unremarkable. No aneurysm is identified. Posterior circulation: The intracranial vertebral arteries are widely patent to the basilar. Patent PICA, AICA, and SCA origins are identified bilaterally.  The basilar artery is widely patent. Posterior communicating arteries are not clearly identified and may be diminutive or absent. PCAs are patent with a moderate proximal P2 stenosis on the left and with severe distal branch vessel stenoses bilaterally. No aneurysm is identified. Venous sinuses: Patent. Anatomic variants: Hypoplastic right A1. Delayed phase: No abnormal enhancement. 1.2 cm infarct involving the lateral left thalamus and posterior limb of internal capsule. Review of the MIP images confirms the above findings IMPRESSION: 1. No emergent large vessel occlusion. 2. Intracranial atherosclerosis most notably involving the PCAs. Moderate left P2 and severe bilateral distal PCA branch vessel stenoses. 3. Widely patent carotid and vertebral arteries. 4. Left thalamic infarct, possibly acute or subacute. Electronically Signed: By: Sebastian Ache M.D. On: 12/05/2018 11:00   Ct Head Wo Contrast  Result Date: 12/05/2018 CLINICAL DATA:  Right hand and right foot numbness. EXAM: CT HEAD WITHOUT CONTRAST TECHNIQUE: Contiguous axial images were obtained from the base of the skull through the vertex without intravenous contrast. COMPARISON:  None. FINDINGS: Brain: No evidence of acute hemorrhage, hydrocephalus, extra-axial collection or mass lesion/mass effect. Moderate deep white matter microangiopathy. Areas of hypoattenuation in the subcortical white matter of the left frontal lobe slightly more pronounced. Vascular: No hyperdense vessel or  unexpected calcification. Skull: Normal. Negative for fracture or focal lesion. Sinuses/Orbits: No acute finding. Other: None. IMPRESSION: 1. Moderate deep white matter microangiopathy. 2. Slightly more pronounced patchy areas of hypoattenuation in the subcortical white matter of the left frontal lobe may represent asymmetric microangiopathy or potentially age-indeterminate ischemic infarcts. 3. No evidence of intracranial hemorrhage. Electronically Signed   By: Ted Mcalpine M.D.   On: 12/05/2018 08:29   Ct Angio Neck W And/or Wo Contrast  Addendum Date: 12/05/2018   ADDENDUM REPORT: 12/05/2018 11:10 ADDENDUM: Preliminary finding of no large vessel occlusion was communicated to Dr. Wilford Corner via telephone on 12/05/2018 at 10:15 a.m. Electronically Signed   By: Sebastian Ache M.D.   On: 12/05/2018 11:10   Result Date: 12/05/2018 CLINICAL DATA:  Right arm and leg weakness and numbness. EXAM: CT ANGIOGRAPHY HEAD AND NECK TECHNIQUE: Multidetector CT imaging of the head and neck was performed using the standard protocol during bolus administration of intravenous contrast. Multiplanar CT image reconstructions and MIPs were obtained to evaluate the vascular anatomy. Carotid stenosis measurements (when applicable) are obtained utilizing NASCET criteria, using the distal internal carotid diameter as the denominator. CONTRAST:  26mL ISOVUE-370 IOPAMIDOL (ISOVUE-370) INJECTION 76% COMPARISON:  None. FINDINGS: CTA NECK FINDINGS Aortic arch: Standard 3 vessel aortic arch. Widely patent arch vessel origins. Right carotid system: Patent without evidence of stenosis or dissection. Left carotid system: Patent without evidence of stenosis or dissection. Vertebral arteries: Patent without evidence of stenosis or dissection. Mildly to moderately dominant left vertebral artery. Skeleton: Multiple dental caries. Extensive periapical lucency associated with a right mandibular molar tooth. Mild-to-moderate cervical disc  degeneration. Other neck: No evidence of acute abnormality or mass. Upper chest: Clear lung apices. Review of the MIP images confirms the above findings CTA HEAD FINDINGS Anterior circulation: The internal carotid arteries are widely patent from skull base to carotid termini. ACAs and MCAs are patent without evidence of proximal branch occlusion or significant proximal stenosis. The right A1 segment is hypoplastic. Anterior communicating arteries is unremarkable. No aneurysm is identified. Posterior circulation: The intracranial vertebral arteries are widely patent to the basilar. Patent PICA, AICA, and SCA origins are identified bilaterally. The basilar artery is widely patent. Posterior communicating arteries are not clearly identified  and may be diminutive or absent. PCAs are patent with a moderate proximal P2 stenosis on the left and with severe distal branch vessel stenoses bilaterally. No aneurysm is identified. Venous sinuses: Patent. Anatomic variants: Hypoplastic right A1. Delayed phase: No abnormal enhancement. 1.2 cm infarct involving the lateral left thalamus and posterior limb of internal capsule. Review of the MIP images confirms the above findings IMPRESSION: 1. No emergent large vessel occlusion. 2. Intracranial atherosclerosis most notably involving the PCAs. Moderate left P2 and severe bilateral distal PCA branch vessel stenoses. 3. Widely patent carotid and vertebral arteries. 4. Left thalamic infarct, possibly acute or subacute. Electronically Signed: By: Sebastian AcheAllen  Grady M.D. On: 12/05/2018 11:00    Pending Labs Unresulted Labs (From admission, onward)    Start     Ordered   12/06/18 0500  Lipid panel  Tomorrow morning,   R     12/05/18 1153   12/06/18 0500  Hemoglobin A1c  Tomorrow morning,   R     12/05/18 1153          Vitals/Pain Today's Vitals   12/05/18 1030 12/05/18 1045 12/05/18 1100 12/05/18 1145  BP: (!) 232/114 (!) 180/102 (!) 199/101 (!) 220/121  Pulse: 100 87 88 89   Resp: 18 19 19 15   Temp:      TempSrc:      SpO2: 99% 98% 97% 98%  Weight:      Height:      PainSc:        Isolation Precautions No active isolations  Medications Medications  sodium chloride flush (NS) 0.9 % injection 3 mL (3 mLs Intravenous Not Given 12/05/18 0828)  aspirin tablet 325 mg (has no administration in time range)    Followed by  aspirin EC tablet 81 mg (has no administration in time range)  atorvastatin (LIPITOR) tablet 80 mg (has no administration in time range)  LORazepam (ATIVAN) injection 0.5 mg (0.5 mg Intravenous Given 12/05/18 0826)  labetalol (NORMODYNE,TRANDATE) injection 10 mg (10 mg Intravenous Given 12/05/18 0850)  iopamidol (ISOVUE-370) 76 % injection 100 mL (80 mLs Intravenous Contrast Given 12/05/18 0911)  labetalol (NORMODYNE,TRANDATE) injection 10 mg (10 mg Intravenous Given 12/05/18 1030)    Mobility walks Low fall risk   Focused Assessments Neuro Assessment Handoff:  Swallow screen pass? Yes    NIH Stroke Scale ( + Modified Stroke Scale Criteria)  Interval: Initial Level of Consciousness (1a.)   : Alert, keenly responsive LOC Questions (1b. )   +: Answers both questions correctly LOC Commands (1c. )   + : Performs both tasks correctly Best Gaze (2. )  +: Normal Visual (3. )  +: No visual loss Facial Palsy (4. )    : Normal symmetrical movements Motor Arm, Left (5a. )   +: No drift Motor Arm, Right (5b. )   +: No drift Motor Leg, Left (6a. )   +: No drift Motor Leg, Right (6b. )   +: No drift Limb Ataxia (7. ): Absent Sensory (8. )   +: Mild-to-moderate sensory loss, patient feels pinprick is less sharp or is dull on the affected side, or there is a loss of superficial pain with pinprick, but patient is aware of being touched Best Language (9. )   +: No aphasia Dysarthria (10. ): Normal Extinction/Inattention (11.)   +: No Abnormality Modified SS Total  +: 1 Complete NIHSS TOTAL: 1     Neuro Assessment:   Neuro Checks:   Initial  (12/05/18 1005)  Last  Documented NIHSS Modified Score: 1 (12/05/18 1005) Has TPA been given? No If patient is a Neuro Trauma and patient is going to OR before floor call report to 4N Charge nurse: [phone] or [phone]     R Recommendations: See Admitting Provider Note  Report given to: 3W

## 2018-12-05 NOTE — ED Notes (Signed)
Patient transported to CT 

## 2018-12-05 NOTE — ED Notes (Signed)
Dr. Rees at bedside  

## 2018-12-05 NOTE — ED Notes (Signed)
Pt to CT

## 2018-12-06 ENCOUNTER — Inpatient Hospital Stay (HOSPITAL_COMMUNITY): Payer: Medicare Other

## 2018-12-06 DIAGNOSIS — I361 Nonrheumatic tricuspid (valve) insufficiency: Secondary | ICD-10-CM

## 2018-12-06 DIAGNOSIS — E1165 Type 2 diabetes mellitus with hyperglycemia: Secondary | ICD-10-CM

## 2018-12-06 DIAGNOSIS — I340 Nonrheumatic mitral (valve) insufficiency: Secondary | ICD-10-CM

## 2018-12-06 DIAGNOSIS — E119 Type 2 diabetes mellitus without complications: Secondary | ICD-10-CM

## 2018-12-06 DIAGNOSIS — I160 Hypertensive urgency: Secondary | ICD-10-CM | POA: Diagnosis present

## 2018-12-06 DIAGNOSIS — I672 Cerebral atherosclerosis: Secondary | ICD-10-CM

## 2018-12-06 DIAGNOSIS — G8191 Hemiplegia, unspecified affecting right dominant side: Secondary | ICD-10-CM

## 2018-12-06 DIAGNOSIS — I10 Essential (primary) hypertension: Secondary | ICD-10-CM

## 2018-12-06 DIAGNOSIS — E876 Hypokalemia: Secondary | ICD-10-CM

## 2018-12-06 DIAGNOSIS — E785 Hyperlipidemia, unspecified: Secondary | ICD-10-CM

## 2018-12-06 DIAGNOSIS — N19 Unspecified kidney failure: Secondary | ICD-10-CM

## 2018-12-06 DIAGNOSIS — N179 Acute kidney failure, unspecified: Secondary | ICD-10-CM | POA: Diagnosis present

## 2018-12-06 LAB — BASIC METABOLIC PANEL
Anion gap: 11 (ref 5–15)
BUN: 29 mg/dL — ABNORMAL HIGH (ref 8–23)
CO2: 22 mmol/L (ref 22–32)
Calcium: 9.5 mg/dL (ref 8.9–10.3)
Chloride: 110 mmol/L (ref 98–111)
Creatinine, Ser: 1.63 mg/dL — ABNORMAL HIGH (ref 0.44–1.00)
GFR calc Af Amer: 37 mL/min — ABNORMAL LOW (ref >60)
GFR calc non Af Amer: 32 mL/min — ABNORMAL LOW (ref >60)
Glucose, Bld: 129 mg/dL — ABNORMAL HIGH (ref 70–99)
Potassium: 4.3 mmol/L (ref 3.5–5.1)
Sodium: 143 mmol/L (ref 135–145)

## 2018-12-06 LAB — HEMOGLOBIN A1C
Hgb A1c MFr Bld: 7 % — ABNORMAL HIGH (ref 4.8–5.6)
Mean Plasma Glucose: 154.2 mg/dL

## 2018-12-06 LAB — GLUCOSE, CAPILLARY: Glucose-Capillary: 120 mg/dL — ABNORMAL HIGH (ref 70–99)

## 2018-12-06 LAB — ECHOCARDIOGRAM COMPLETE
Height: 61 in
Weight: 2720 oz

## 2018-12-06 LAB — LIPID PANEL
Cholesterol: 217 mg/dL — ABNORMAL HIGH (ref 0–200)
HDL: 57 mg/dL (ref >40)
LDL Cholesterol: 133 mg/dL — ABNORMAL HIGH (ref 0–99)
Total CHOL/HDL Ratio: 3.8 RATIO
Triglycerides: 134 mg/dL (ref <150)
VLDL: 27 mg/dL (ref 0–40)

## 2018-12-06 LAB — HIV ANTIBODY (ROUTINE TESTING W REFLEX): HIV Screen 4th Generation wRfx: NONREACTIVE

## 2018-12-06 MED ORDER — LIVING WELL WITH DIABETES BOOK
Freq: Once | Status: DC
  Filled 2018-12-06: qty 1

## 2018-12-06 MED ORDER — CLOPIDOGREL BISULFATE 75 MG PO TABS
75.0000 mg | ORAL_TABLET | Freq: Every day | ORAL | Status: DC
  Administered 2018-12-06 – 2018-12-07 (×2): 75 mg via ORAL
  Filled 2018-12-06 (×2): qty 1

## 2018-12-06 MED ORDER — CHLORTHALIDONE 25 MG PO TABS
25.0000 mg | ORAL_TABLET | Freq: Every day | ORAL | Status: DC
  Administered 2018-12-06 – 2018-12-07 (×2): 25 mg via ORAL
  Filled 2018-12-06 (×2): qty 1

## 2018-12-06 MED ORDER — AMLODIPINE BESYLATE 5 MG PO TABS
5.0000 mg | ORAL_TABLET | Freq: Every day | ORAL | Status: DC
  Administered 2018-12-06 – 2018-12-07 (×2): 5 mg via ORAL
  Filled 2018-12-06 (×2): qty 1

## 2018-12-06 MED ORDER — INSULIN ASPART 100 UNIT/ML ~~LOC~~ SOLN: 0.0000 [IU] | Freq: Every day | SUBCUTANEOUS | Status: DC

## 2018-12-06 MED ORDER — SODIUM CHLORIDE 0.9 % IV SOLN
INTRAVENOUS | Status: AC
  Administered 2018-12-06: 16:00:00 via INTRAVENOUS

## 2018-12-06 MED ORDER — SODIUM CHLORIDE 0.9 % IV BOLUS: 1000.0000 mL | Freq: Once | INTRAVENOUS | Status: DC

## 2018-12-06 MED ORDER — INSULIN ASPART 100 UNIT/ML ~~LOC~~ SOLN
0.0000 [IU] | Freq: Three times a day (TID) | SUBCUTANEOUS | Status: DC
  Administered 2018-12-06: 1 [IU] via SUBCUTANEOUS

## 2018-12-06 MED ORDER — SODIUM CHLORIDE 0.9 % IV SOLN: INTRAVENOUS | Status: DC

## 2018-12-06 NOTE — Progress Notes (Signed)
OT Cancellation Note  Patient Details Name: Christine Glass MRN: [id number] DOB: 06-16-1946   Cancelled Treatment:    Reason Eval/Treat Not Completed: Medical issues which prohibited therapy. Pt is hypertensive with BP of 214/115 while seated in recliner chair. RN notified. OT will follow up when safe for pt to participate.   [name] P 12/06/2018, 1:00 PM

## 2018-12-06 NOTE — Evaluation (Signed)
Physical Therapy Evaluation Patient Details Name: Christine Glass MRN: [id number] DOB: Jun 25, 1946 Today's Date: 12/06/2018   History of Present Illness  Pt is a 73 y/o female admitted secondary to R sided numbness. MRI of the brain revealed acute/subacute nonhemorrhagic L thalamic infarct. No known PMH.    Clinical Impression  Pt presented OOB in bathroom with RN present, awake and willing to participate in therapy session. Prior to admission, pt reported that she was independent with all functional mobility and ADLs. Pt currently at modified independence with bed mobility and transfers. Min guard to supervision for very short distance ambulation within room. Limited secondary to elevated BP (229/129 mmHg in sitting after ambulating from bathroom. BP in supine is 216/115 mmHg at end of session. RN present and aware). PT will continue to follow acutely to progress mobility as tolerated and appropriate.  Pt would continue to benefit from skilled physical therapy services at this time while admitted and after d/c to address the below listed limitations in order to improve overall safety and independence with functional mobility.     Follow Up Recommendations Home health PT;Other (comment)(for further assessment if pt d/c's today)    Equipment Recommendations  None recommended by PT    Recommendations for Other Services       Precautions / Restrictions Precautions Precautions: Fall Precaution Comments: monitor BP Restrictions Weight Bearing Restrictions: No      Mobility  Bed Mobility Overal bed mobility: Modified Independent                Transfers Overall transfer level: Modified independent Equipment used: None                Ambulation/Gait Ambulation/Gait assistance: Min guard;Supervision Gait Distance (Feet): 10 Feet Assistive device: None Gait Pattern/deviations: Step-through pattern;Decreased stride length Gait velocity: decreased   General Gait Details:  ambulated from bathroom to bed within room without difficulty, no LOB or instability noted; however, very limited secondary to elevated BP (229/129 - RN present and aware)  Stairs            Wheelchair Mobility    Modified Rankin (Stroke Patients Only) Modified Rankin (Stroke Patients Only) Pre-Morbid Rankin Score: No symptoms Modified Rankin: No significant disability     Balance Overall balance assessment: Needs assistance Sitting-balance support: Feet supported Sitting balance-Leahy Scale: Good     Standing balance support: No upper extremity supported Standing balance-Leahy Scale: Fair                               Pertinent Vitals/Pain Pain Assessment: No/denies pain    Home Living Family/patient expects to be discharged to:: Private residence Living Arrangements: Spouse/significant other Available Help at Discharge: Family;Available 24 hours/day Type of Home: House Home Access: Level entry     Home Layout: Two level;Other (Comment)(basement) Home Equipment: Cane - single point      Prior Function Level of Independence: Independent               Hand Dominance        Extremity/Trunk Assessment   Upper Extremity Assessment Upper Extremity Assessment: Defer to OT evaluation;Overall [facility] for tasks assessed    Lower Extremity Assessment Lower Extremity Assessment: Overall WFL for tasks assessed       Communication   Communication: No difficulties  Cognition Arousal/Alertness: Awake/alert Behavior During Therapy: WFL for tasks assessed/performed Overall Cognitive Status: Within Functional Limits for tasks assessed  General Comments      Exercises     Assessment/Plan    PT Assessment Patient needs continued PT services  PT Problem List Decreased mobility;Decreased coordination;Decreased knowledge of precautions       PT Treatment Interventions DME instruction;Gait  training;Stair training;Therapeutic exercise;Therapeutic activities;Functional mobility training;Balance training;Neuromuscular re-education;Patient/family education    PT Goals (Current goals can be found in the Care Plan section)  Acute Rehab PT Goals Patient Stated Goal: "to go home today" PT Goal Formulation: With patient Time For Goal Achievement: 12/20/18 Potential to Achieve Goals: Good    Frequency Min 4X/week   Barriers to discharge        Co-evaluation               AM-PAC PT "6 Clicks" Mobility  Outcome Measure Help needed turning from your back to your side while in a flat bed without using bedrails?: None Help needed moving from lying on your back to sitting on the side of a flat bed without using bedrails?: None Help needed moving to and from a bed to a chair (including a wheelchair)?: None Help needed standing up from a chair using your arms (e.g., wheelchair or bedside chair)?: None Help needed to walk in hospital room?: A Little Help needed climbing 3-5 steps with a railing? : A Little 6 Click Score: 22    End of Session   Activity Tolerance: Treatment limited secondary to medical complications (Comment);Other (comment)(limited secondary to elevated BP) Patient left: in bed;with call bell/phone within reach;with bed alarm set;with family/visitor present;Other (comment)(RN present administering meds) Nurse Communication: Mobility status;Other (comment)(elevated BP) PT Visit Diagnosis: Other symptoms and signs involving the nervous system (R83.094)    Time: 0768-0881 PT Time Calculation (min) (ACUTE ONLY): 19 min   Charges:   PT Evaluation $PT Eval Moderate Complexity: 1 Mod          Deborah Chalk, PT, DPT  Acute Rehabilitation Services Pager [phone] Office [phone]    Alessandra Bevels Vasily Fedewa 12/06/2018, 9:57 AM

## 2018-12-06 NOTE — Progress Notes (Signed)
  Echocardiogram 2D Echocardiogram has been performed.  Afsa Meany L Androw 12/06/2018, 11:26 AM

## 2018-12-06 NOTE — Progress Notes (Addendum)
Subjective:  Christine Glass is a 73 y.o. F with no significant past medical history admit for left thalamic CVA on hospital day 1  Ms.Carias was examined and evaluated at bedside this AM with family present. She was observed resting comfortably in bed. She states her numbness has almost completely resolved and she feels ready to go back home. She was able to walk to bathroom without difficulty and states she was told she can go home with home pt. She denies any new weakness, numbness, or tingling. She denies any headaches, blurry vision, chest pain, palpitations. Results of her morning labs and the importance of following up with PCP was explained to her. Ms.Kope expressed understanding.  Objective:  Vital signs in last 24 hours: Vitals:   12/05/18 2347 12/06/18 0343 12/06/18 0810 12/06/18 0915  BP: (!) 156/85 (!) 146/74 (!) 205/111 (!) 229/129  Pulse: 79 78 87 100  Resp: 18 18 16    Temp: 98 F (36.7 C) 97.9 F (36.6 C) 97.7 F (36.5 C)   TempSrc: Oral Axillary Oral   SpO2: 93% 96% 98%   Weight:      Height:       Physical Exam  Constitutional: She is well-developed, well-nourished, and in no distress. No distress.  HENT:  Mouth/Throat: Oropharynx is clear and moist.  Eyes: Conjunctivae are normal. No scleral icterus.  Cardiovascular: Normal rate, regular rhythm, normal heart sounds and intact distal pulses.  No murmur heard. Pulmonary/Chest: Effort normal. She has no wheezes. She has rales (bibasilar crackles).  Musculoskeletal: Normal range of motion.        General: No edema.  Neurological:  Neurologic exam: Mental status: A&Ox3 Cranial Nerves: II: PERRL III, IV, VI: Extra-occular motions intact bilaterally V, VII: Face symmetric, sensation intact in all 3 divisions  VIII: hearing normal to rubbing fingers bilaterally  IX, X: palate rises symmetrically XI: Head turn and shoulder shrug normal  bilaterally  XII: tongue midline  Motor: Strength 5/5 on all upper and lower extremities, bulk muscle and tone are normal, no pronator drift Sensory: Light touch intact and symmetric bilaterally     Assessment/Plan:  Active Problems:   Acute CVA (cerebrovascular accident) [facility])  Mrs.Bolinger is a 73 yo F w/ no significant PMH presenting with right sided numbness most likely 2/2 CVA. MRI show left non-hemorrhagic thalamic CVA. Awaiting echocardiogram. Currently presenting symptoms appear to have mostly resolved. PT recommending home health PT.  Right sided numbness/weakness 2/2 CVA Mri showing acute non-hemorrhagic L thalamic stroke. Hgb a1c 7.0 Ldl 133. Telemetry showing no arrythmias. - Appreciate neuro recs: DAPT w/ asa and clopidogrel - C/w Atorvastatin 80mg  - F/u TTE  Hyperglycemia 2/2 Diabetes Hemoglobin a1c 7.0 Has some family history. Fasting bg this am 129. - Consult to diabetes coordinator for education - SSI - Glucose checks - Will need to start metformin at discharge or inpatient if prolonged stay  Severe uncontrolled hypertension AM BP 146/74. Required 1 dose of labetalol yesterday around 5pm. - Allowing permissive hypertension up to 220 systolic in setting of stroke - Labetalol 10mg  q2hr PRN for systolic BP >220, diastolic >120 - Will need to start anti-hypertensive regimen at discharge  Acute Kidney Injury vs CKD Admit creatinine 1.36 trended up to 1.63. No baseline creatinine on file. Received contrast load yesterday.  - Trend BMP - 0.9% NS 100cc/hr for 5 hrs  Hypokalemia Admit potassium 3.3->4.3 - Resolved  DVT prophx: subqheparin Diet: Cardiac Bowel: Senokot Code: Full  Dispo: Anticipated discharge in approximately 0-1 day(s).  Theotis Barrio, MD 12/06/2018, 10:27 AM Pager: [phone]

## 2018-12-06 NOTE — Evaluation (Signed)
Clinical/Bedside Swallow Evaluation Patient Details  Name: Christine Glass MRN: [id number] Date of Birth: 10/26/1945  Today's Date: 12/06/2018 Time: SLP Start Time (ACUTE ONLY): 1000 SLP Stop Time (ACUTE ONLY): 1015 SLP Time Calculation (min) (ACUTE ONLY): 15 min  Past Medical History:  Past Medical History:  Diagnosis Date  . Stroke [facility])    Past Surgical History: History reviewed. No pertinent surgical history. HPI:  Christine Glass is a 73 y.o. female who presents to the Emergency Department complaining of numbness. She presents to the emergency department complaining of numbness that began when she awoke at 4 AM this morning. She was last known well between 9 and 10 PM last night when she went to bed. When she awoke at four she stated that she was having numbness in her right arm and right leg with some mild associated weakness. She denies any vision changes, headache, chest pain, vomiting. No prior similar symptoms. She has no known medical problems and takes no medications.  MRI 2/26: "Acute/subacute nonhemorrhagic left thalamic infarct measures up to 13 mm. This corresponds with the patient's new onset of right-sided symptoms."   Assessment / Plan / Recommendation Clinical Impression  Pt presents with functional swallowing as assessed clinically.  Pt tolerated all consistencies trialed with no clinical s/s of aspiration and exhibited good oral clearance of solids . Recommend regular texture diet with thin liquids.     Pt denies changes to speech and cognition.  Pt was able to participate in conversation without difficulty and speech was clear.    SLP Visit Diagnosis: Dysphagia, unspecified (R13.10)    Aspiration Risk  No limitations    Diet Recommendation Regular;Thin liquid   Liquid Administration via: Straw;Cup Medication Administration: Whole meds with liquid Supervision: Patient able to self feed Compensations: Slow rate;Small sips/bites Postural Changes: Seated upright at 90  degrees    Other  Recommendations Oral Care Recommendations: Oral care BID   Follow up Recommendations None        Swallow Study   General HPI: Christine Glass is a 73 y.o. female who presents to the Emergency Department complaining of numbness. She presents to the emergency department complaining of numbness that began when she awoke at 4 AM this morning. She was last known well between 9 and 10 PM last night when she went to bed. When she awoke at four she stated that she was having numbness in her right arm and right leg with some mild associated weakness. She denies any vision changes, headache, chest pain, vomiting. No prior similar symptoms. She has no known medical problems and takes no medications.  MRI 2/26: "Acute/subacute nonhemorrhagic left thalamic infarct measures up to 13 mm. This corresponds with the patient's new onset of right-sided symptoms." Type of Study: Bedside Swallow Evaluation Diet Prior to this Study: Regular Temperature Spikes Noted: No Respiratory Status: Room air History of Recent Intubation: No Behavior/Cognition: Alert;Cooperative;Pleasant mood Oral Cavity Assessment: Within Functional Limits Oral Care Completed by SLP: No Oral Cavity - Dentition: Adequate natural dentition Vision: Functional for self-feeding Self-Feeding Abilities: Able to feed self Patient Positioning: Upright in bed Baseline Vocal Quality: Normal Volitional Cough: Strong Volitional Swallow: Able to elicit    Oral/Motor/Sensory Function Overall Oral Motor/Sensory Function: Within functional limits   Ice Chips Ice chips: Not tested   Thin Liquid Thin Liquid: Within functional limits Presentation: Straw    Nectar Thick Nectar Thick Liquid: Not tested   Honey Thick Honey Thick Liquid: Not tested   Puree Puree: Within functional limits Presentation:  Spoon   Solid     Solid: Within functional limits Presentation: Self Fed      Kerrie Pleasure, MA, CCC-SLP Acute Rehabilitation  Services Office: [phone]; Pager (2/27): [phone] 12/06/2018,10:35 AM

## 2018-12-06 NOTE — Progress Notes (Addendum)
Inpatient Diabetes Program Recommendations  AACE/ADA: New Consensus Statement on Inpatient Glycemic Control  Target Ranges:  Prepandial:   less than 140 mg/dL      Peak postprandial:   less than 180 mg/dL (1-2 hours)      Critically ill patients:  140 - 180 mg/dL  Results for Christine Glass, Christine Glass (MRN [id number]) as of 12/06/2018 11:45  Ref. Range 12/06/2018 06:15  Glucose Latest Ref Range: 70 - 99 mg/dL 030 (H)  Hemoglobin S9Q Latest Ref Range: 4.8 - 5.6 % 7.0 (H)   Results for Christine Glass, Christine Glass (MRN [id number]) as of 12/06/2018 11:45  Ref. Range 12/05/2018 08:17  Glucose Latest Ref Range: 70 - 99 mg/dL 333 (H)   Review of Glycemic Control  Diabetes history:NO Outpatient Diabetes medications: NA Current orders for Inpatient glycemic control: Novolog 0-9 units TID with meals  Inpatient Diabetes Program Recommendations:  HgbA1C: A1C 7% on 12/06/18 indicating an average glucose of 154 mg/dl over the past 2-3 months. Per MD note today, patient being newly dx with DM. Ordered Living Well with Diabetes book, RD consult for diet eduation, and patient education by Bedside RNs. Will plan to see patient today.  Addendum 12/06/18@13 :30-Spoke with patient about new diabetes diagnosis. Patient states that she does not have a PCP but has insurance. Patient has not seen a doctor in over 5 years and she has never been told she had DM.   Discussed A1C results (7.0% on 12/06/18) and explained what an A1C is and informed patient that current A1C indicates an average glucose of 154 mg/dl over the past 2-3 months. Discussed basic pathophysiology of DM Type 2, basic home care, importance of checking CBGs and maintaining good CBG control to prevent long-term and short-term complications. Reviewed glucose and A1C goals.  Reviewed signs and symptoms of hyperglycemia and hypoglycemia along with treatment for both. Discussed impact of nutrition, exercise, stress, sickness, and medications on diabetes control. Informed patient she would  be receiving Living Well with diabetes booklet and encouraged patient to read through entire book. Informed patient that if she is prescribed DM medication at discharge it would be an oral medication. Discussed Carb Modified diet, carbs per meal, and portion size. Informed patient that RD will be providing further education on diet. Patient states she has provider in mind that she would like to get established with and she plans to reach out to the provider and see if they will see her as a new patient.  Patient verbalized understanding of information discussed and she states that she has no further questions at this time related to diabetes.   RNs to provide ongoing basic DM education at bedside with this patient and engage patient to actively check blood glucose.    Thanks, Orlando Penner, RN, MSN, CDE Diabetes Coordinator Inpatient Diabetes Program [phone] (Team Pager from 8am to 5pm)

## 2018-12-06 NOTE — Plan of Care (Signed)
Pt is progressing towards goals of care

## 2018-12-06 NOTE — Progress Notes (Addendum)
STROKE TEAM PROGRESS NOTE   INTERVAL HISTORY Her husband is at the bedside.  Her daughter-in-law arrived at the end of the visit. Pt up in chair. Tingling and numbness R hand and foot much improved since yesterday. Still with abnormal sensation in R hand but not as prominent. BP remains elevated and she is concerned. Treatment and workup underway.  She has a known hx of HTN and stopped taking meds years ago.   Vitals:   12/06/18 0810 12/06/18 0915 12/06/18 1149 12/06/18 1259  BP: (!) 205/111 (!) 229/129 (!) 223/123 (!) 214/115  Pulse: 87 100 95   Resp: 16  20   Temp: 97.7 F (36.5 C)  98.4 F (36.9 C)   TempSrc: Oral  Oral   SpO2: 98%  99%   Weight:      Height:        CBC:  Recent Labs  Lab 12/05/18 0817  WBC 7.4  NEUTROABS 4.8  HGB 13.2  HCT 42.1  [facility] Aortic arch: Standard 3 vessel aortic arch. Widely patent arch vessel origins. Right carotid system: Patent without evidence of stenosis or dissection. Left carotid system: Patent without evidence of stenosis or dissection. Vertebral arteries: Patent without evidence of stenosis or dissection. Mildly to moderately dominant left vertebral artery. Skeleton: Multiple dental caries. Extensive periapical lucency associated with a right mandibular molar tooth. Mild-to-moderate cervical disc degeneration. Other neck: No evidence of acute abnormality or mass. Upper chest: Clear lung apices. Review of the MIP images confirms the above findings CTA HEAD FINDINGS Anterior circulation: The internal carotid arteries are widely patent from skull base to carotid termini. ACAs and MCAs are patent without evidence of proximal branch occlusion or significant proximal stenosis. The right A1 segment is hypoplastic. Anterior communicating arteries is unremarkable. No aneurysm is identified. Posterior circulation: The intracranial vertebral arteries are widely patent to  the basilar. Patent PICA, AICA, and SCA origins are identified bilaterally. The basilar artery is widely patent. Posterior communicating arteries are not clearly identified and may be diminutive or absent. PCAs are patent with a moderate proximal P2 stenosis on the left and with severe distal branch vessel stenoses bilaterally. No aneurysm is identified. Venous sinuses: Patent. Anatomic variants: Hypoplastic right A1. Delayed  phase: No abnormal enhancement. 1.2 cm infarct involving the lateral left thalamus and posterior limb of internal capsule. Review of the MIP images confirms the above findings IMPRESSION: 1. No emergent large vessel occlusion. 2. Intracranial atherosclerosis most notably involving the PCAs. Moderate left P2 and severe bilateral distal PCA branch vessel stenoses. 3. Widely patent carotid and vertebral arteries. 4. Left thalamic infarct, possibly acute or subacute. Electronically Signed: By: Sebastian Ache M.D. On: 12/05/2018 11:00   Ct Head Wo Contrast  Result Date: 12/05/2018 CLINICAL DATA:  Right hand and right foot numbness. EXAM: CT HEAD WITHOUT CONTRAST TECHNIQUE: Contiguous axial images were obtained from the base of the skull through the vertex without intravenous contrast. COMPARISON:  None. FINDINGS: Brain: No evidence of acute hemorrhage, hydrocephalus, extra-axial collection or mass lesion/mass effect. Moderate deep white matter microangiopathy. Areas of hypoattenuation in the subcortical white matter of the left frontal lobe slightly more pronounced. Vascular: No hyperdense vessel or unexpected calcification. Skull: Normal. Negative for fracture or focal lesion. Sinuses/Orbits: No acute finding. Other: None. IMPRESSION: 1. Moderate deep white matter microangiopathy. 2. Slightly more pronounced patchy areas of hypoattenuation in the subcortical white matter of the left frontal lobe may represent asymmetric microangiopathy or potentially age-indeterminate ischemic infarcts. 3. No evidence of intracranial hemorrhage. Electronically Signed   By: Ted Mcalpine M.D.   On: 12/05/2018 08:29   Ct Angio Neck W And/or Wo Contrast  Addendum Date: 12/05/2018   ADDENDUM REPORT: 12/05/2018 11:10 ADDENDUM: Preliminary finding of no large vessel occlusion was communicated to Dr. Wilford Corner via telephone on 12/05/2018 at 10:15 a.m. Electronically Signed   By: Sebastian Ache M.D.   On: 12/05/2018 11:10   Result Date:  12/05/2018 CLINICAL DATA:  Right arm and leg weakness and numbness. EXAM: CT ANGIOGRAPHY HEAD AND NECK TECHNIQUE: Multidetector CT imaging of the head and neck was performed using the standard protocol during bolus administration of intravenous contrast. Multiplanar CT image reconstructions and MIPs were obtained to evaluate the vascular anatomy. Carotid stenosis measurements (when applicable) are obtained utilizing NASCET criteria, using the distal internal carotid diameter as the denominator. CONTRAST:  75mL ISOVUE-370 IOPAMIDOL (ISOVUE-370) INJECTION 76% COMPARISON:  None. FINDINGS: CTA NECK FINDINGS Aortic arch: Standard 3 vessel aortic arch. Widely patent arch vessel origins. Right carotid system: Patent without evidence of stenosis or dissection. Left carotid system: Patent without evidence of stenosis or dissection. Vertebral arteries: Patent without evidence of stenosis or dissection. Mildly to moderately dominant left vertebral artery. Skeleton: Multiple dental caries. Extensive periapical lucency associated with a right mandibular molar tooth. Mild-to-moderate cervical disc degeneration. Other neck: No evidence of acute abnormality or mass. Upper chest: Clear lung apices. Review of the MIP images confirms the above findings CTA HEAD FINDINGS Anterior circulation: The internal carotid arteries are widely patent from skull base to carotid termini. ACAs and MCAs are patent without evidence of proximal branch occlusion or significant proximal stenosis. The right A1 segment is hypoplastic. Anterior communicating arteries is unremarkable. No aneurysm is identified. Posterior circulation: The intracranial vertebral arteries are widely patent to the basilar. Patent PICA, AICA, and SCA origins are identified bilaterally. The basilar  artery is widely patent. Posterior communicating arteries are not clearly identified and may be diminutive or absent. PCAs are patent with a moderate proximal P2 stenosis on the left  and with severe distal branch vessel stenoses bilaterally. No aneurysm is identified. Venous sinuses: Patent. Anatomic variants: Hypoplastic right A1. Delayed phase: No abnormal enhancement. 1.2 cm infarct involving the lateral left thalamus and posterior limb of internal capsule. Review of the MIP images confirms the above findings IMPRESSION: 1. No emergent large vessel occlusion. 2. Intracranial atherosclerosis most notably involving the PCAs. Moderate left P2 and severe bilateral distal PCA branch vessel stenoses. 3. Widely patent carotid and vertebral arteries. 4. Left thalamic infarct, possibly acute or subacute. Electronically Signed: By: Sebastian Ache M.D. On: 12/05/2018 11:00   Mr Brain Wo Contrast  Result Date: 12/05/2018 CLINICAL DATA:  New onset of numbness in the right upper and lower extremity upon waking up this morning at 4 a.m. Last known normal at 9 p.m. last night before going to bed. EXAM: MRI HEAD WITHOUT CONTRAST TECHNIQUE: Multiplanar, multiecho pulse sequences of the brain and surrounding structures were obtained without intravenous contrast. COMPARISON:  CTA head and neck 12/05/2017. FINDINGS: Brain: Diffusion-weighted images confirm an acute nonhemorrhagic infarct involving the left thalamus measuring up to 13 mm. T2 signal changes are associated with the infarct, suggesting the infarct is at least several hours old. Moderate generalized atrophy and white matter disease is present otherwise. Confluent periventricular and scattered subcortical T2 hyperintensities are noted bilaterally. White matter changes extend into the brainstem. No acute hemorrhage or mass lesion is present. The internal auditory canals are within normal limits. Vascular: Flow is present in the major intracranial arteries. Skull and upper cervical spine: The craniocervical junction is normal. Upper cervical spine is within normal limits. Marrow signal is unremarkable. Sinuses/Orbits: The paranasal sinuses and mastoid  air cells are clear. The globes and orbits are within normal limits. IMPRESSION: 1. Acute/subacute nonhemorrhagic left thalamic infarct measures up to 13 mm. This corresponds with the patient's new onset of right-sided symptoms. 2. Moderate diffuse periventricular and subcortical white matter disease and atrophy likely reflects the sequela of chronic microvascular ischemia. Electronically Signed   By: Marin Roberts M.D.   On: 12/05/2018 14:47   2D Echocardiogram   1. The left ventricle has low normal systolic function, with an ejection fraction of 50-55%. The cavity size was mildly dilated. Left ventricular diastolic parameters were normal.  2. Infero basal hypokinesis.  3. The right ventricle has normal systolic function. The cavity was normal. There is no increase in right ventricular wall thickness.  4. The mitral valve is degenerative. Moderate thickening of the mitral valve leaflet. Mild calcification of the mitral valve leaflet.  5. The tricuspid valve is normal in structure.  6. The aortic valve is tricuspid Moderate thickening of the aortic valve Sclerosis without any evidence of stenosis of the aortic valve.   PHYSICAL EXAM General: Awake alert in no distress sitting comfortably up in chair at bedside. Mildly anxious.  HEENT: Normocephalic atraumatic  CVS: Regular rate rhythm Respiratory: Breathing comfortably and saturating normally on room air. Extremities: Warm well perfused no edema Neurological exam She is awake alert oriented x3. Her speech is not dysarthric. Naming comprehension and repetition are intact. Cranial nerves: Pupils equal round reactive to light, extraocular movements intact, visual fields full, facial sensation intact to light touch bilaterally, face is symmetric, auditory acuity is grossly intact, shoulder shrug intact, tongue midline.  Palate midline. Motor exam no drift upper  extremities. No weakness noted upper extremities. Bilateral lower extremities  5/5 without drift  Sensory exam: intact bilaterally  Coordination: No ataxia noted   ASSESSMENT/PLAN [name] is a 73 y.o. female with history of HTN not taking meds for several years presenting with R sided numbness.   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 L frontal love hypoattenuation  CTA head & neck no ELVO. Severe B PCA, mod L P2 stenosis. L thalamic infarct.   MRI  L thalamic infarct 13 mm. Mod Small vessel disease. Atrophy.   2D Echo  EF 50-55%. No source of embolus   LDL 133  HgbA1c 7.0  Heparin 5000 units sq tid for VTE prophylaxis  aspirin 81 mg daily prior to admission, treated with aspirin 325 mg daily day #1 followed by 81 mg. Given mild stroke, recommend aspirin 81 mg and plavix 75 mg daily x 3 weeks, then aspirin alone. Orders adjusted.   Therapy recommendations:  HH PT, OT pending   Disposition:  Return home w/ husband  Hypertensive Urgency  BP 205/111 on arrival  Remains elevated this am . Ok for Permissive hypertension (OK if < 220/120) but gradually normalize in 5-7 days . Long-term BP goal normotensive  Hyperlipidemia  Home meds:  No statin  Now on lipitor 80  LDL 133, goal < 70  Continue statin at discharge  Diabetes type II, new dx  HgbA1c 7.0, at goal < 7.0  DB coordinator following  Other Stroke Risk Factors  Advanced age  Obesity, Body mass index is 32.12 kg/m., recommend weight loss, diet and exercise as appropriate   Other Active Problems  AKI vs CKD Cr 1.36->1.63 s/p contrast for CTA.  Hypokalemia 3.3->4.3  Hospital day # 1  Annie Main, MSN, APRN, ANVP-BC, AGPCNP-BC Advanced Practice Stroke Nurse [facility] See Amion for Schedule & Pager information 12/06/2018 1:24 PM   ATTENDING NOTE: I reviewed above note and agree with the assessment and plan. Pt was seen and examined.   73 year old female with no significant past  medical history admitted for right arm and leg numbness and heaviness.  CT no acute abnormality.  CTA head neck showed left P2 short segment mild to moderate stenosis.  MRI showed left thalamic infarct.  LDL 133 and A1c 7.0.  Creatinine 1.36-1.63.  EF 50 to 55%.  Neuro examination intact except patient has subjective right fingertip tingling.  Patient stroke likely due to small vessel disease given risk factors and location.  She has no significant past medical history however she has not seen PCP for years.  On this admission, she was found to have severely elevated blood pressure and hyperlipidemia as well as diabetes which is a new diagnosis.  Patient was educated on stroke risk factor modification.  Recommend aspirin 81 and Plavix 75 for 3 weeks and then either aspirin or Plavix alone.  Continue Lipitor 80 for stroke prevention.  Neurology will sign off. Please call with questions. Pt will follow up with stroke clinic NP at [facility] in about 4 weeks. Thanks for the consult.   Marvel Plan, MD PhD Stroke Neurology 12/06/2018 3:51 PM     To contact Stroke Continuity provider, please refer to [email or web address]. After hours, contact General Neurology

## 2018-12-06 NOTE — Progress Notes (Signed)
  Date: 12/06/2018  Patient name: Christine Glass  Medical record number: [id number]  Date of birth: Aug 04, 1946   I have seen and evaluated this patient and I have discussed the plan of care with the house staff. Please see their note for complete details. I concur with their findings with the following additions/corrections:   Please see my attestation of the H&P from 12/05/2018  Jessy Oto, M.D., Ph.D. 12/06/2018, 2:14 PM

## 2018-12-07 DIAGNOSIS — Z7902 Long term (current) use of antithrombotics/antiplatelets: Secondary | ICD-10-CM

## 2018-12-07 DIAGNOSIS — Z7982 Long term (current) use of aspirin: Secondary | ICD-10-CM

## 2018-12-07 DIAGNOSIS — Z79899 Other long term (current) drug therapy: Secondary | ICD-10-CM

## 2018-12-07 LAB — BASIC METABOLIC PANEL
Anion gap: 10 (ref 5–15)
BUN: 31 mg/dL — ABNORMAL HIGH (ref 8–23)
CO2: 21 mmol/L — ABNORMAL LOW (ref 22–32)
Calcium: 8.8 mg/dL — ABNORMAL LOW (ref 8.9–10.3)
Chloride: 109 mmol/L (ref 98–111)
Creatinine, Ser: 1.35 mg/dL — ABNORMAL HIGH (ref 0.44–1.00)
GFR calc Af Amer: 46 mL/min — ABNORMAL LOW (ref >60)
GFR calc non Af Amer: 40 mL/min — ABNORMAL LOW (ref >60)
Glucose, Bld: 126 mg/dL — ABNORMAL HIGH (ref 70–99)
Potassium: 4.3 mmol/L (ref 3.5–5.1)
Sodium: 140 mmol/L (ref 135–145)

## 2018-12-07 LAB — GLUCOSE, CAPILLARY: Glucose-Capillary: 112 mg/dL — ABNORMAL HIGH (ref 70–99)

## 2018-12-07 MED ORDER — CHLORTHALIDONE 25 MG PO TABS: 25.0000 mg | ORAL_TABLET | Freq: Every day | ORAL | 0 refills | Status: AC

## 2018-12-07 MED ORDER — CLOPIDOGREL BISULFATE 75 MG PO TABS: 75.0000 mg | ORAL_TABLET | Freq: Every day | ORAL | 0 refills | Status: AC

## 2018-12-07 MED ORDER — AMLODIPINE BESYLATE 5 MG PO TABS: 5.0000 mg | ORAL_TABLET | Freq: Every day | ORAL | 0 refills | Status: AC

## 2018-12-07 MED ORDER — ASPIRIN 81 MG PO TBEC: 81.0000 mg | DELAYED_RELEASE_TABLET | Freq: Every day | ORAL | 0 refills | Status: DC

## 2018-12-07 MED ORDER — ATORVASTATIN CALCIUM 80 MG PO TABS: 80.0000 mg | ORAL_TABLET | Freq: Every day | ORAL | 0 refills | Status: AC

## 2018-12-07 MED ORDER — SODIUM CHLORIDE 0.9 % IV SOLN: INTRAVENOUS | Status: AC

## 2018-12-07 NOTE — Plan of Care (Signed)
Independent, with bp elev.

## 2018-12-07 NOTE — Evaluation (Addendum)
Occupational Therapy Evaluation and Discharge Patient Details Name: Christine Glass MRN: [id number] DOB: [birth year] Today's Date: 12/07/2018    History of Present Illness Pt is a 73 y/o female admitted secondary to R sided numbness. MRI of the brain revealed acute/subacute nonhemorrhagic L thalamic infarct. No known PMH.   Clinical Impression   This 73 yo female admitted with above presents to acute OT at an independent level. We will D/C from acute OT.  BP at start 212/116 with RN then giving IV meds BP at end 210/114     Follow Up Recommendations  No OT follow up    Equipment Recommendations  None recommended by OT       Precautions / Restrictions Precautions Precautions: Fall Precaution Comments: monitor BP Restrictions Weight Bearing Restrictions: No      Mobility Bed Mobility Overal bed mobility: Independent                Transfers Overall transfer level: Independent Equipment used: None                  Balance Overall balance assessment: No apparent balance deficits (not formally assessed)                         ADL either performed or assessed with clinical judgement   ADL Overall ADL's : Independent                                             Vision Baseline Vision/History: No visual deficits              Pertinent Vitals/Pain Pain Assessment: No/denies pain     Hand Dominance Right   Extremity/Trunk Assessment Upper Extremity Assessment Upper Extremity Assessment: Overall WFL for tasks assessed(only reports numbness at fingertips of right hand, but not interferring with function)           Communication Communication Communication: No difficulties   Cognition Arousal/Alertness: Awake/alert Behavior During Therapy: WFL for tasks assessed/performed Overall Cognitive Status: Within Functional Limits for tasks assessed                                                Home  Living Family/patient expects to be discharged to:: Private residence Living Arrangements: Spouse/significant other Available Help at Discharge: Family;Available 24 hours/day Type of Home: House Home Access: Level entry     Home Layout: Two level;Other (Comment)(basement) Alternate Level Stairs-Number of Steps: flight   Bathroom Shower/Tub: Chief Strategy Officer: Standard     Home Equipment: Cane - single point          Prior Functioning/Environment Level of Independence: Independent                          OT Goals(Current goals can be found in the care plan section) Acute Rehab OT Goals Patient Stated Goal: "to go home today"  OT Frequency:                AM-PAC OT "6 Clicks" Daily Activity     Outcome Measure Help from another person eating meals?: None Help from another person taking care of personal grooming?: None Help from another  person toileting, which includes using toliet, bedpan, or urinal?: None Help from another person bathing (including washing, rinsing, drying)?: None Help from another person to put on and taking off regular upper body clothing?: None Help from another person to put on and taking off regular lower body clothing?: None 6 Click Score: 24   End of Session Equipment Utilized During Treatment: (none) Nurse Communication: (BP up, RN gave IV meds)  Activity Tolerance: Patient tolerated treatment well Patient left: (in room sitting EOB)                   Time: 1140-1200 OT Time Calculation (min): 20 min Charges:  OT General Charges $OT Visit: 1 Visit OT Evaluation $OT Eval Low Complexity: 1 Low  Ignacia Palma, OTR/L Acute [employer] Pager [phone] Office [phone]     Evette Georges 12/07/2018, 12:11 PM

## 2018-12-07 NOTE — Plan of Care (Addendum)
Nutrition Education Note  RD consult for New Dx of DM Education.   CBG (last 3)  Recent Labs    12/05/18 0752 12/06/18 2315 12/07/18 0614  GLUCAP 146* 120* 112*   Lab Results  Component Value Date   HGBA1C 7.0 (H) 12/06/2018   Handouts given to the Pt were provided from [facility] Nutrition Care Manual and included:  "Carbohydrate Counting for people with Diabetes, Diabetes Label Reading Tips, and Using Nutrition Labels; Carbohydrates".  Reviewed diet recall. Pt typically eats Peanut butter toast with black coffee for breakfast. Lunch includes snacks, like crackers, fruit, cheese, or eggs.. Sometimes pt will eat leftovers, or vegetables. Dinner is typical of a meat, starch, and vegetable. Pt states her and husband rarely eats out. Points discussed from education was what she identifies as carbohydrates, which were breads, potatoes, and pasta. She was able to identify items within her diet that are high carbohydrate. Continued with carbohydrate servings and eating consistently 3-5 servings for main meals and 1-2 CHO serving for snacks and provided examples that the pt typically eats. Reviewed the food label and identified serving size and carbohydrate grams per serving. Also educated pt to be successful, she needed to check blood sugar several times a day in addition to consistent eating and medication. Lastly plate method was also used to show pt how to set up plate of lean meats, non-starchy vegetables, and starchy vegetables. Teach back method used, pt was able to recall food items that were high in carbohydrates, and states that she doesn't drink sweeten tea or coffee (that she prefers black or unsweetened). Pt was able to recall serving size and watch for CHO per serving. Pt was also able to recount types of starchy vs. Non-starchy vegetables.   Pt expected compliance is good; pt seemed ready to make changes in addition to her fairly healthy diet pattern.   Body mass index is 32.12 kg/m. Pt is  classified as Obese I.   Pt is on <2g/day Na Heart Healthy Diet, per stroke/HTN. No CHO mod currently as New Dx of DM. Pt meal completion is >50% per meal completion and pt report.   Labs and medications reviewed. No further nutrition interventions at this time. If further nutrition needs arise, please consult RD. Pt was agreeable to outpatient referral to [facility], but was concerned with location (living out of town) and was more so wondering if they could refer her to a more local education/class place.   Burnard Bunting, [facility] Dietetic Intern

## 2018-12-07 NOTE — Progress Notes (Signed)
Physical Therapy Treatment Patient Details Name: Christine Glass MRN: [id number] DOB: 11-24-1945 Today's Date: 12/07/2018    History of Present Illness Pt is a 73 y/o female admitted secondary to R sided numbness. MRI of the brain revealed acute/subacute nonhemorrhagic L thalamic infarct. No known PMH.    PT Comments    Pt making steady progress with functional mobility. Her BP remains elevated throughout (see below for details). Pt's RN in room and administered meds during session. PT will continue to follow pt acutely to progress mobility as tolerated and appropriate.  BP supine at beginning of session = 224/123 BP sitting EOB = 216/116 BP standing = 221/143 *pt's RN administered IV BP meds and asked to reassess after ~2 mins BP sitting EOB after meds = 166/132 BP at end of session after activity = 189/112  Pt's RN aware of all BP readings.     Follow Up Recommendations  No PT follow up     Equipment Recommendations  None recommended by PT    Recommendations for Other Services       Precautions / Restrictions Precautions Precautions: Fall Precaution Comments: monitor BP Restrictions Weight Bearing Restrictions: No    Mobility  Bed Mobility Overal bed mobility: Modified Independent                Transfers Overall transfer level: Modified independent Equipment used: None                Ambulation/Gait Ambulation/Gait assistance: Min guard;Supervision Gait Distance (Feet): 200 Feet Assistive device: None Gait Pattern/deviations: Step-through pattern;Decreased stride length;Drifts right/left Gait velocity: decreased   General Gait Details: pt with mild instability with ambulation but no overt LOB or need for physical assistance   Stairs Stairs: Yes Stairs assistance: Supervision Stair Management: Two rails;Step to pattern;Forwards Number of Stairs: 2 General stair comments: no instability or LOB   Wheelchair Mobility    Modified Rankin  (Stroke Patients Only)       Balance Overall balance assessment: Needs assistance Sitting-balance support: Feet supported Sitting balance-Leahy Scale: Normal     Standing balance support: No upper extremity supported Standing balance-Leahy Scale: Good               High level balance activites: Direction changes;Turns;Sudden stops;Head turns High Level Balance Comments: min guard for safety, mild drifting R/L            Cognition Arousal/Alertness: Awake/alert Behavior During Therapy: WFL for tasks assessed/performed Overall Cognitive Status: Within Functional Limits for tasks assessed                                        Exercises      General Comments        Pertinent Vitals/Pain Pain Assessment: No/denies pain    Home Living                      Prior Function            PT Goals (current goals can now be found in the care plan section) Acute Rehab PT Goals PT Goal Formulation: With patient Time For Goal Achievement: 12/20/18 Potential to Achieve Goals: Good Progress towards PT goals: Progressing toward goals    Frequency    Min 4X/week      PT Plan Current plan remains appropriate    Co-evaluation  AM-PAC PT "6 Clicks" Mobility   Outcome Measure  Help needed turning from your back to your side while in a flat bed without using bedrails?: None Help needed moving from lying on your back to sitting on the side of a flat bed without using bedrails?: None Help needed moving to and from a bed to a chair (including a wheelchair)?: None Help needed standing up from a chair using your arms (e.g., wheelchair or bedside chair)?: None Help needed to walk in hospital room?: None Help needed climbing 3-5 steps with a railing? : None 6 Click Score: 24    End of Session   Activity Tolerance: Patient tolerated treatment well Patient left: in bed;with call bell/phone within reach;with family/visitor  present Nurse Communication: Mobility status;Other (comment)(elevated BP) PT Visit Diagnosis: Other symptoms and signs involving the nervous system (B09.628)     Time: 3662-9476 PT Time Calculation (min) (ACUTE ONLY): 40 min  Charges:  $Gait Training: 8-22 mins $Therapeutic Activity: 23-37 mins                     Deborah Chalk, [place], DPT  Acute Rehabilitation Services Pager [phone] Office [phone]     Alessandra Bevels Arianis Bowditch 12/07/2018, 10:53 AM

## 2018-12-07 NOTE — Care Management Note (Addendum)
Case Management Note  Patient Details  Name: Christine Glass MRN: [id number] Date of Birth: 12/11/1945  Subjective/Objective:   Pt admitted with CVA. She is from home with spouse who she says can provide supervision at home. DME at home: cane and shower chair but not using No issues with home medications. Pt driving prior to admission.                  Action/Plan: No f/u per PT and no DME needs. CM following for further d/c needs, physician orders.   Addendum (1310): pt discharging home with self care. Pt has transportation home.   Expected Discharge Date:                  Expected Discharge Plan:  Home/Self Care  In-House Referral:     Discharge planning Services     Post Acute Care Choice:    Choice offered to:     DME Arranged:    DME Agency:     HH Arranged:    HH Agency:     Status of Service:  In process, will continue to follow  If discussed at Long Length of Stay Meetings, dates discussed:    Additional Comments:  Kermit Balo, RN 12/07/2018, 12:01 PM

## 2018-12-07 NOTE — Progress Notes (Signed)
   Subjective:  Christine Glass is a 73 y.o. F with no significant past medical history admit for left thalamic CVA on hospital day 2  Christine Glass was examined and evaluated at bedside this AM with husband present. No acute complaints overnight. She states she feels fine and is requesting to be discharged. Discussed our advice to stay inpatient for renal function monitoring but she states she has a close follow up scheduled with new PCP next week. Discussed importance of taking all of her discharge medications. Results of her echocardiogram was explained to her with recommendation to f/u with cardiologist. She expressed understanding.  Objective:  Vital signs in last 24 hours: Vitals:   12/06/18 1951 12/06/18 2315 12/07/18 0321 12/07/18 0726  BP: (!) 228/119 (!) 160/85 (!) 197/92 (!) 201/97  Pulse: 88 82 89 98  Resp: 20 19 20 18   Temp: 98.2 F (36.8 C) 97.8 F (36.6 C) 98.1 F (36.7 C) 99 F (37.2 C)  TempSrc: Oral Oral Oral Oral  SpO2: 99% 97% 100% 97%  Weight:      Height:        Gen: Well-developed, well nourished, NAD Neck: supple, ROM intact CV: RRR, S1, S2 normal, No rubs, no murmurs, no gallops Pulm: CTAB, No rales, no wheezes, no dullness to percussion  Extm: ROM intact, Peripheral pulses intact, no peripheral edema Skin: Dry, Warm, normal turgor Neuro: AAOx3t Psych: Normal mood and affect  Vital signs in last 24 hours:  Assessment/Plan:  Principal Problem:   Acute CVA (cerebrovascular accident) [facility]) Active Problems:   Essential hypertension   Diabetes (HCC)   Acute kidney injury (HCC)   Hypertensive urgency  Christine Glass is a 73 yo F w/ no significant PMH presenting with right sided numbness most likely 2/2 CVA. Stroke work-up has been completed and neuro has already signed off. Unclear if renal function is back to baseline but patient requesting discharge. Will discharge home today.  Right sided numbness/weakness 2/2 CVA Mri showing acute non-hemorrhagic L  thalamic stroke. Hgb a1c 7.0 Ldl 133. TTE show no PFO - Appreciate neuro recs: DAPT w/ asa and clopidogrel - C/w Atorvastatin 80mg  - Discharge home today  Hyperglycemia 2/2 Diabetes Hemoglobin a1c 7.0 Has some family history. - SSI - Glucose checks  Severe uncontrolled hypertension AM BP 197/92. Started on amlodipine and chlorthalidone yesterday. - Labetalol 10mg  q2hr PRN for systolic BP >220, diastolic >120 - C/w current regimen  Acute Kidney Injury vs CKD Admit creatinine 1.36 trended up to 1.63 back down to 1.35 w/ fluid resuscitation. Unclear baseline - Trend BMP  DVT prophx: subqheparin Diet: Cardiac Bowel: Senokot Code: Full  Dispo: Anticipated discharge in approximately today(s).   Theotis Barrio, MD 12/07/2018, 11:53 AM Pager: [phone]

## 2018-12-07 NOTE — Discharge Summary (Signed)
Name: Christine Glass MRN: [id number] DOB: March 31, 1946 73 y.o. PCP: Patient, No Pcp Per  Date of Admission: 12/05/2018  7:34 AM Date of Discharge: 12/07/2018 Attending Physician: Lenice Pressman MD  Discharge Diagnosis: 1. Left acute thalamic infarct 2. Diabetes Mellitus 3. Hypertension 4. Hyperlipidemia  Discharge Medications: Allergies as of 12/07/2018   No Known Allergies     Medication List    STOP taking these medications   aspirin 81 MG chewable tablet Replaced by:  aspirin 81 MG EC tablet     TAKE these medications   amLODipine 5 MG tablet Commonly known as:  NORVASC Take 1 tablet (5 mg total) by mouth daily.   aspirin 81 MG EC tablet Take 1 tablet (81 mg total) by mouth daily. Replaces:  aspirin 81 MG chewable tablet   atorvastatin 80 MG tablet Commonly known as:  LIPITOR Take 1 tablet (80 mg total) by mouth daily at 6 PM.   chlorthalidone 25 MG tablet Commonly known as:  HYGROTON Take 1 tablet (25 mg total) by mouth daily.   clopidogrel 75 MG tablet Commonly known as:  PLAVIX Take 1 tablet (75 mg total) by mouth daily for 21 days. Stop after 3 weeks       Disposition and follow-up:   Christine Glass was discharged from [facility] in Stable condition.  At the hospital follow up visit please address:  1.  Left acute thalamic infarct - Ensure she follows up with neurology - Ensure she complete 3 week course of aspirin and Plavix and then aspirin alone  2. Hypertensive Urgency - Significant hypertension with systolics consistently above 160s  - Started on chlorthalidone and amlodipine - Adjust dosage / add on different class antihypertensives as indicated  3. Diabetes Mellitus - Newly diagnosed (hgb a1c 7.0) during admission with lifestyle modifications counseled - Please start metformin if appropriate  4. Hyperlipidemia - Started on lipitor 80 for stroke prevention - TTE showing infero-basal hypokinesis on left ventricle -  Please have her follow up with cardiology - Assess for side effects and adjust dosage/change medication as indicated  2.  Labs / imaging needed at time of follow-up: None  3.  Pending labs/ test needing follow-up: None  Follow-up Appointments: Follow-up Information    [facility] Follow up in 4 week(s).   Specialty:  Neurology Why:  stroke clinic. office will call with appt date and time.  Contact information: [address] [place] Hospital Course by problem list: 1. Acute L Thalamic Stroke: Christine Glass is a 73yo F presenting with R sided numbness. Neurology consulted. Found to have no hemorrhage or mass on CT head. CTA showing intracranial atherosclerosis but no occlusion. Mri showing acute non-hemorrhagic left thalamic infarct. TTE showing no PFO. Started on DAPT with aspirin and Plavix. Discharged w/ recommendation to continue aspirin and plavix for 3 weeks and then aspirin alone. Also recommended to f/u with neurology.  2. Hypertensive Urgency: Presented with blood pressure of 210/140. Allowed for permissive hypertension in setting of acute stroke. 48 hours after presentation of symptoms, started on amlodipine 5 and chlorthalidone 25. Blood pressure fluctuating with 160s to 180s. Recommended to stay inpatient while watching for blood pressure decrease but patient requested earlier discharge. Discharged with close f/u with PCP for hypertension managemnent.  3. Diabetes Mellitus: Found to have hemoglobin a1c of 7 while risk stratification for stroke re-occurrence. Diabetes educator met with patient about nutrition  counseling with new diagnosis. Discharged with recommendation to f/u with PCP concerning initiation of metformin and diabetes management.  4. Hyperlipidemia: Found to have LDL of 133 while risk stratification for stroke re-occurrence. Started on atorvastatin 39m daily. TTE found incidental  infero-basal hypokinesis on left ventricle during stroke work-up for embolic source. Discharged w/ recommendation to f/u with cardiology.   Discharge Vitals:   BP (!) 201/97 (BP Location: Left Arm)   Pulse 98   Temp 99 F (37.2 C) (Oral)   Resp 18   Ht _0  (1.549 m)   Wt 77.1 kg   SpO2 97%   BMI 32.12 kg/m   Pertinent Labs, Studies, and Procedures:  Lipid Panel     Component Value Date/Time   CHOL 217 (H) 12/06/2018 0615   TRIG 134 12/06/2018 0615   HDL 57 12/06/2018 0615   CHOLHDL 3.8 12/06/2018 0615   VLDL 27 12/06/2018 0615   LDLCALC 133 (H) 12/06/2018 0615   Lab Results  Component Value Date   HGBA1C 7.0 (H) 12/06/2018   TTE  1. The left ventricle has low normal systolic function, with an ejection fraction of 50-55%. The cavity size was mildly dilated. Left ventricular diastolic parameters were normal.  2. Infero basal hypokinesis.  3. The right ventricle has normal systolic function. The cavity was normal. There is no increase in right ventricular wall thickness.  4. The mitral valve is degenerative. Moderate thickening of the mitral valve leaflet. Mild calcification of the mitral valve leaflet.  5. The tricuspid valve is normal in structure.  6. The aortic valve is tricuspid Moderate thickening of the aortic valve Sclerosis without any evidence of stenosis of the aortic valve.  MRI BRAIN IMPRESSION: 1. Acute/subacute nonhemorrhagic left thalamic infarct measures up to 13 mm. This corresponds with the patient's new onset of right-sided symptoms. 2. Moderate diffuse periventricular and subcortical white matter disease and atrophy likely reflects the sequela of chronic microvascular ischemia.  CTA Head / Neck IMPRESSION: 1. Moderate deep white matter microangiopathy. 2. Slightly more pronounced patchy areas of hypoattenuation in the subcortical white matter of the left frontal lobe may represent asymmetric microangiopathy or potentially age-indeterminate  ischemic infarcts. 3. No evidence of intracranial hemorrhage.  CT Head IMPRESSION: 1. No emergent large vessel occlusion. 2. Intracranial atherosclerosis most notably involving the PCAs. Moderate left P2 and severe bilateral distal PCA branch vessel stenoses. 3. Widely patent carotid and vertebral arteries. 4. Left thalamic infarct, possibly acute or subacute.  Discharge Instructions: Discharge Instructions    Amb Referral to Nutrition and Diabetic E   Complete by:  As directed    Ambulatory referral to Neurology   Complete by:  As directed    Follow up with stroke clinic NP (Jessica Vanschaick or CCecille Rubin if both not available, consider Dr. PAntony Contras Dr. VBess Harvest or [facility] in about 4 weeks.   Call MD for:  difficulty breathing, headache or visual disturbances   Complete by:  As directed    Call MD for:  extreme fatigue   Complete by:  As directed    Call MD for:  persistant dizziness or light-headedness   Complete by:  As directed    Call MD for:  persistant nausea and vomiting   Complete by:  As directed    Diet - low sodium heart healthy   Complete by:  As directed    Face-to-face encounter (required for Medicare/Medicaid patients)   Complete by:  As directed  I Mosetta Anis certify that this patient is under my care and that I, or a nurse practitioner or physician's assistant working with me, had a face-to-face encounter that meets the physician face-to-face encounter requirements with this patient on 12/06/2018. The encounter with the patient was in whole, or in part for the following medical condition(s) which is the primary reason for home health care (List medical condition):  Acute Left thalamic stroke   The encounter with the patient was in whole, or in part, for the following medical condition, which is the primary reason for home health care:  Left thalamic stroke   I certify that, based on my findings,  the following services are medically necessary home health services:  Physical therapy   Reason for Medically Necessary Home Health Services:  Therapy- Therapeutic Exercises to Increase Strength and Endurance   My clinical findings support the need for the above services:  Unsafe ambulation due to balance issues   Further, I certify that my clinical findings support that this patient is homebound due to:  Unsafe ambulation due to balance issues   Home Health   Complete by:  As directed    To provide the following care/treatments:  PT   Increase activity slowly   Complete by:  As directed       Signed: Mosetta Anis, MD 12/10/2018, 9:13 AM   Pager: [phone]

## 2018-12-07 NOTE — Discharge Instructions (Signed)
Dear Christine Glass  You came to [place] with right sided numbness. We have determined this was caused by a stroke. Here are our recommendations for you at discharge:  - Start aspirin 81mg  daily - Start clopidogrel 75mg  daily for 3 weeks and then stop - Start atorvastatin 80mg  daily - Start amlodipine 5mg  daily - Start chlorthalidone 25mg  daily - Please make sure to follow up with your primary care provider - Make sure you let him/her know that you need your kidney function checked - Make sure your PCP knows about our recommendation that you follow up with a heart doctor - Also discuss with your primary care provider about starting a diabetes medication - Neurology office will call you to schedule a follow up visit for your stroke  Thank you for choosing [facility]   Stroke Prevention Some medical conditions and lifestyle choices can lead to a higher risk for a stroke. You can help to prevent a stroke by making nutrition, lifestyle, and other changes. What nutrition changes can be made?   Eat healthy foods. ? Choose foods that are high in fiber. These include:  Fresh fruits.  Fresh vegetables.  Whole grains. ? Eat at least 5 or more servings of fruits and vegetables each day. Try to fill half of your plate at each meal with fruits and vegetables. ? Choose lean protein foods. These include:  Lowfat (lean) cuts of meat.  Chicken without skin.  Fish.  Tofu.  Beans.  Nuts. ? Eat low-fat dairy products. ? Avoid foods that:  Are high in salt (sodium).  Have saturated fat.  Have trans fat.  Have cholesterol.  Are processed.  Are premade.  Follow eating guidelines as told by your doctor. These may include: ? Reducing how many calories you eat and drink each day. ? Limiting how much salt you eat or drink each day to 1,500 milligrams (mg). ? Using only healthy fats for cooking. These include:  Olive oil.  Canola oil.  Sunflower oil. ? Counting how many  carbohydrates you eat and drink each day. What lifestyle changes can be made?  Try to stay at a healthy weight. Talk to your doctor about what a good weight is for you.  Get at least 30 minutes of moderate physical activity at least 5 days a week. This can include: ? Fast walking. ? Biking. ? Swimming.  Do not use any products that have nicotine or tobacco. This includes cigarettes and e-cigarettes. If you need help quitting, ask your doctor. Avoid being around tobacco smoke in general.  Limit how much alcohol you drink to no more than 1 drink a day for nonpregnant women and 2 drinks a day for men. One drink equals 12 oz of beer, 5 oz of wine, or 1 oz of hard liquor.  Do not use drugs.  Avoid taking birth control pills. Talk to your doctor about the risks of taking birth control pills if: ? You are over 36 years old. ? You smoke. ? You get migraines. ? You have had a blood clot. What other changes can be made?  Manage your cholesterol. ? It is important to eat a healthy diet. ? If your cholesterol cannot be managed through your diet, you may also need to take medicines. Take medicines as told by your doctor.  Manage your diabetes. ? It is important to eat a healthy diet and to exercise regularly. ? If your blood sugar cannot be managed through diet and exercise, you may need  to take medicines. Take medicines as told by your doctor.  Control your high blood pressure (hypertension). ? Try to keep your blood pressure below 130/80. This can help lower your risk of stroke. ? It is important to eat a healthy diet and to exercise regularly. ? If your blood pressure cannot be managed through diet and exercise, you may need to take medicines. Take medicines as told by your doctor. ? Ask your doctor if you should check your blood pressure at home. ? Have your blood pressure checked every year. Do this even if your blood pressure is normal.  Talk to your doctor about getting checked for a  sleep disorder. Signs of this can include: ? Snoring a lot. ? Feeling very tired.  Take over-the-counter and prescription medicines only as told by your doctor. These may include aspirin or blood thinners (antiplatelets or anticoagulants).  Make sure that any other medical conditions you have are managed. Where to find more information  American Stroke Association: www.strokeassociation.org  National Stroke Association: www.stroke.org Get help right away if:  You have any symptoms of stroke. "BE FAST" is an easy way to remember the main warning signs: ? B - Balance. Signs are dizziness, sudden trouble walking, or loss of balance. ? E - Eyes. Signs are trouble seeing or a sudden change in how you see. ? F - Face. Signs are sudden weakness or loss of feeling of the face, or the face or eyelid drooping on one side. ? A - Arms. Signs are weakness or loss of feeling in an arm. This happens suddenly and usually on one side of the body. ? S - Speech. Signs are sudden trouble speaking, slurred speech, or trouble understanding what people say. ? T - Time. Time to call emergency services. Write down what time symptoms started.  You have other signs of stroke, such as: ? A sudden, very bad headache with no known cause. ? Feeling sick to your stomach (nausea). ? Throwing up (vomiting). ? Jerky movements you cannot control (seizure). These symptoms may represent a serious problem that is an emergency. Do not wait to see if the symptoms will go away. Get medical help right away. Call your local emergency services (911 in the [place] Do not drive yourself to the hospital. Summary  You can prevent a stroke by eating healthy, exercising, not smoking, drinking less alcohol, and treating other health problems, such as diabetes, high blood pressure, or high cholesterol.  Do not use any products that contain nicotine or tobacco, such as cigarettes and e-cigarettes.  Get help right away if you have any  signs or symptoms of a stroke. This information is not intended to replace advice given to you by your health care provider. Make sure you discuss any questions you have with your health care provider. Document Released: 03/27/2012 Document Revised: 12/28/2016 Document Reviewed: 12/28/2016 Elsevier Interactive Patient Education  2019 [employer].

## 2019-01-15 ENCOUNTER — Telehealth: Payer: Self-pay | Admitting: Adult Health

## 2019-01-15 NOTE — Telephone Encounter (Signed)
LVM to discuss changing appt to video visit

## 2019-01-21 NOTE — Telephone Encounter (Signed)
Pt is giving consent to VV   Email- [email or web address]

## 2019-01-21 NOTE — Telephone Encounter (Signed)
I called pt and receive video consent and to file insurance consent. Her meds and pharmacy were updated.Email confirmed. Explain changing visit to video due to COVID 19. Pt will use her android phone and was given web ex to download on her phone.

## 2019-01-23 NOTE — Progress Notes (Signed)
[facility] [address] [place]. [place] [address] [phone]       VIRTUAL VISIT FOLLOW UP NOTE  [name] Date of Birth:  1946/09/12 Medical Record Number:  [id number]   Reason for Referral:  hospital stroke follow up    Virtual Visit via Video Note  I connected with Christine Glass on 01/24/19 at  8:15 AM EDT by a video enabled telemedicine application located remotely in my own home and verified that I am speaking with the correct person using two identifiers who was located at their own home.   I discussed the limitations of evaluation and management by telemedicine and the availability of in person appointments. The patient expressed understanding and agreed to proceed.   CHIEF COMPLAINT:  Chief Complaint  Patient presents with  . 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 History obtained from patient and chart review. Reviewed all radiology images and labs personally.   [name] is a 73 y.o. female with history of HTN not taking meds for several years along with not seeing PCP for years who presented with R sided numbness. CT head reviewed and showed small vessel disease and left frontal lobe hypoattenuation. CTA head and neck negative for emergent LVO but did show severe bilateral PCA, moderate left P2 stenosis, and left thalamic infarct. MRI brain reviewed and showed left thalamic infarct 13mm and moderate SVD and atrophy. 2D echo showed an EF of 50-55% without cardiac source of embolus identified. Infarct secondary to small vessel disease therefore recommended DAPT for 3 weeks then aspirin or plavix alone. BP elevated with SBP 200s. Recommended long term BP goal normotensive range. LDL 133 and  initiated atorvastatin  daily. A1c 7.0 and recommended continued follow up with PCP for DM management (new dx). Other stroke risk factors include advanced age and obesity. She was discharged home in stable condition with [facility] PT/OT.   She has been doing well since she returned home without residual deficits or reoccurring/new symptoms.  She has returned back to all prior activities without difficulties.  She was evaluated by home health therapy but was released as this was not needed.  She does live with her husband and is able to maintain all ADLs and IADLs without difficulty. Continues on aspirin without side effects of bleeding or bruising as she completed 3 weeks of DAPT. Continues on lipitor without side effects of myalgias. Blood pressure stable with during visit reading at 137/88. She has been monitoring glucose levels with this mornings reading 120 after eating. She greatly modified her diet and will be evaluated by endocrinology in 03/2019. She has follow up visit with PCP on 02/14/19. No concerns. Denies new or worsening stroke/TIA symptoms.     ROS:   14 system review of systems performed and negative with exception of no concerns    PMH:  Past Medical History:  Diagnosis Date  . Stroke [facility])     PSH: No past surgical history on file.  Social History:  Social History   Socioeconomic History  . Marital status: Married    Spouse name: Not on file  . Number of children: Not on file  . Years of education: Not on file  . Highest education level: Not on file  Occupational History  . Not on file  Social Needs  . Financial resource strain: Not on file  . Food insecurity:    Worry: Not on file    Inability: Not on file  . Transportation needs:    Medical: Not on file    Non-medical: Not on file  Tobacco Use  . Smoking status: Never Smoker  . Smokeless tobacco: Never Used  Substance and Sexual Activity  . Alcohol use: Never    Frequency: Never  . Drug use: Never  . Sexual  activity: Not on file  Lifestyle  . Physical activity:    Days per week: Not on file    Minutes per session: Not on file  . Stress: Not on file  Relationships  . Social connections:    Talks on phone: Not on file    Gets together: Not on file    Attends religious service: Not on file    Active member of club or organization: Not on file    Attends meetings of clubs or organizations: Not on file    Relationship status: Not on file  . Intimate partner violence:    Fear of current or ex partner: Not on file    Emotionally abused: Not on file    Physically abused: Not on file    Forced sexual activity: Not on file  Other Topics Concern  . Not on file  Social History Narrative  . Not on file    Family History: No family history on file.  Medications:   Current Outpatient Medications on File Prior to Visit  Medication Sig Dispense Refill  . amLODipine (NORVASC) 5 MG tablet Take 1 tablet (5 mg total) by mouth daily. 30 tablet 0  . atorvastatin (LIPITOR) 80 MG tablet Take 1 tablet (80 mg total) by mouth daily at 6 PM. 30 tablet 0  . chlorthalidone (HYGROTON) 25 MG tablet Take 1 tablet (25 mg total) by mouth daily. 30 tablet 0   No current facility-administered medications on file prior to visit.     Allergies:  No Known Allergies   Physical Exam  Vitals:   01/24/19 0831  BP: 137/88   There is no height or weight on file to calculate BMI. No exam data present  Dep[facility] screen [facility] 01/24/2019  Decreased Interest 0  Down, Depressed, Hopeless 0  PHQ - 2 Score 0     General: well developed, well nourished, pleasant elderly caucasian female, seated, in no evident distress Head: head normocephalic and atraumatic.     Neurologic Exam Mental Status: Awake and fully alert. Oriented to place and time. Recent and remote memory intact. Attention span, concentration and fund of knowledge appropriate. Mood and affect appropriate.  Cranial Nerves: Extraocular movements full  without nystagmus. Hearing intact to voice. Face, tongue, palate moves normally and symmetrically.  Shoulder shrug symmetric Motor: No evidence of weakness project assessment.  Able to stand with arms crossed from seated position. Sensory.: UTA but subjectively denies any temperature variation or numbness/tingling Coordination: Rapid alternating movements normal in all extremities. Finger-to-nose and heel-to-shin performed accurately bilaterally. Gait and Station: Arises from chair without difficulty. Stance is normal. Gait demonstrates normal stride length and balance. Able to heel, toe and tandem walk without difficulty.  Reflexes: UTA   NIHSS  0 Modified Rankin  0    Diagnostic Data (Labs, Imaging, Testing)  CT HEAD WO CONTRAST 12/05/18 IMPRESSION: 1. Moderate deep white matter microangiopathy. 2. Slightly more pronounced patchy areas of hypoattenuation in  the subcortical white matter of the left frontal lobe may represent asymmetric microangiopathy or potentially age-indeterminate ischemic infarcts. 3. No evidence of intracranial hemorrhage.  CT ANGIO HEAD W OR WO CONTRAST CT ANGIO NECK W OR WO CONTRAST 12/05/18 IMPRESSION: 1. No emergent large vessel occlusion. 2. Intracranial atherosclerosis most notably involving the PCAs. Moderate left P2 and severe bilateral distal PCA branch vessel stenoses. 3. Widely patent carotid and vertebral arteries. 4. Left thalamic infarct, possibly acute or subacute.  MR BRAIN WO CONTRAST 12/05/18 IMPRESSION: 1. Acute/subacute nonhemorrhagic left thalamic infarct measures up to 13 mm. This corresponds with the patient's new onset of right-sided symptoms. 2. Moderate diffuse periventricular and subcortical white matter disease and atrophy likely reflects the sequela of chronic microvascular ischemia.  ECHOCARDIOGRAM 12/06/18 IMPRESSIONS  1. The left ventricle has low normal systolic function, with an ejection fraction of 50-55%. The  cavity size was mildly dilated. Left ventricular diastolic parameters were normal.  2. Infero basal hypokinesis.  3. The right ventricle has normal systolic function. The cavity was normal. There is no increase in right ventricular wall thickness.  4. The mitral valve is degenerative. Moderate thickening of the mitral valve leaflet. Mild calcification of the mitral valve leaflet.  5. The tricuspid valve is normal in structure.  6. The aortic valve is tricuspid Moderate thickening of the aortic valve Sclerosis without any evidence of stenosis of the aortic valve.    ASSESSMENT: Magnolia Kowitz is a 73 y.o. 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 Vascular risk factors include uncontrolled HTN, HLD and new dx of DM.  She has been doing well from a stroke standpoint with resolution of all symptoms and denies any new or worsening stroke/TIA symptoms.    PLAN:  1. Left thalamic infarct : Continue aspirin 81 mg daily  and atorvastatin 80 mg for secondary stroke prevention. Maintain strict control of hypertension with blood pressure goal below 130/90, diabetes with hemoglobin A1c goal below 6.5% and cholesterol with LDL cholesterol (bad cholesterol) goal below 70 mg/dL.  I also advised the patient to eat a healthy diet with plenty of whole grains, cereals, fruits and vegetables, exercise regularly with at least 30 minutes of continuous activity daily and maintain ideal body weight. 2. HTN: Advised to continue current treatment regimen.  Today's BP 137/88.  Advised to continue to monitor at home along with continued follow-up with PCP for management 3. HLD: Advised to continue current treatment regimen along with continued follow-up with PCP for future prescribing and monitoring of lipid panel 4. DMII: Advised to continue to monitor glucose levels at home along with continued follow-up with PCP for management and monitoring    Follow up in 6 months or  call earlier if needed   Greater than 50% of time during this 25 minute visit was spent on counseling, explanation of diagnosis of left thalamic infarct, reviewing risk factor management of HTN, HLD and DM, planning of further management along with potential future management, and discussion with patient and family answering all questions.    Christine Glass, AGNP-BC  [facility] [address] [place], [place] [address]  Phone [phone] Fax [phone] Note: This document was prepared with digital dictation and possible smart phrase technology. Any transcriptional errors that result from this process are unintentional.

## 2019-01-24 ENCOUNTER — Ambulatory Visit (INDEPENDENT_AMBULATORY_CARE_PROVIDER_SITE_OTHER): Payer: Medicare Other | Admitting: Adult Health

## 2019-01-24 ENCOUNTER — Other Ambulatory Visit: Payer: Self-pay

## 2019-01-24 ENCOUNTER — Telehealth: Payer: Self-pay | Admitting: *Deleted

## 2019-01-24 ENCOUNTER — Encounter: Payer: Self-pay | Admitting: Adult Health

## 2019-01-24 VITALS — BP 137/88

## 2019-01-24 DIAGNOSIS — I10 Essential (primary) hypertension: Secondary | ICD-10-CM | POA: Diagnosis not present

## 2019-01-24 DIAGNOSIS — I6381 Other cerebral infarction due to occlusion or stenosis of small artery: Secondary | ICD-10-CM

## 2019-01-24 DIAGNOSIS — I639 Cerebral infarction, unspecified: Secondary | ICD-10-CM

## 2019-01-24 DIAGNOSIS — E785 Hyperlipidemia, unspecified: Secondary | ICD-10-CM | POA: Diagnosis not present

## 2019-01-24 DIAGNOSIS — E089 Diabetes mellitus due to underlying condition without complications: Secondary | ICD-10-CM

## 2019-01-24 MED ORDER — ASPIRIN 81 MG PO TBEC: 81.0000 mg | DELAYED_RELEASE_TABLET | Freq: Every day | ORAL | 3 refills | Status: AC

## 2019-01-24 NOTE — Progress Notes (Signed)
I agree with the above plan 

## 2019-01-24 NOTE — Telephone Encounter (Signed)
Message left requesting a call back to schedule her 6 month follow up per provider request.

## 2019-08-01 ENCOUNTER — Ambulatory Visit: Payer: Medicare Other | Admitting: Adult Health

## 2019-10-31 ENCOUNTER — Ambulatory Visit: Payer: Medicare Other | Attending: Internal Medicine

## 2019-10-31 DIAGNOSIS — Z23 Encounter for immunization: Secondary | ICD-10-CM | POA: Insufficient documentation

## 2019-10-31 NOTE — Progress Notes (Signed)
   Covid-19 Vaccination Clinic  Name:  Christine Glass    MRN: [id number] DOB: 07/13/1946  10/31/2019  [name] was observed post Covid-19 immunization for 15 minutes without incidence. She was provided with Vaccine Information Sheet and instruction to access the V-Safe system.   [name] was instructed to call 911 with any severe reactions post vaccine: Marland Kitchen Difficulty breathing  . Swelling of your face and throat  . A fast heartbeat  . A bad rash all over your body  . Dizziness and weakness    Immunizations Administered    Name Date Dose VIS Date Route   Pfizer COVID-19 Vaccine 10/31/2019  6:11 PM 0.3 mL 09/20/2019 Intramuscular   Manufacturer: [employer], [employer]   Lot: [id number]   NDC: [address]-3

## 2019-11-21 ENCOUNTER — Ambulatory Visit: Payer: Medicare Other | Attending: Internal Medicine

## 2019-11-21 DIAGNOSIS — Z23 Encounter for immunization: Secondary | ICD-10-CM | POA: Insufficient documentation

## 2019-11-21 NOTE — Progress Notes (Signed)
   Covid-19 Vaccination Clinic  Name:  Christine Glass    MRN: [id number] DOB: 1946/08/09  11/21/2019  Christine Glass was observed post Covid-19 immunization for 15 minutes without incidence. She was provided with Vaccine Information Sheet and instruction to access the V-Safe system.   Christine Glass was instructed to call 911 with any severe reactions post vaccine: Marland Kitchen Difficulty breathing  . Swelling of your face and throat  . A fast heartbeat  . A bad rash all over your body  . Dizziness and weakness    Immunizations Administered    Name Date Dose VIS Date Route   Pfizer COVID-19 Vaccine 11/21/2019 10:44 AM 0.3 mL 09/20/2019 Intramuscular   Manufacturer: [employer], [employer]   Lot: [id number]   NDC: [address]-1

## 2020-06-03 IMAGING — CT CT HEAD W/O CM
3 series · 16 of 47 positions shown, 19 images · non-contrast
Comparison: None.

CLINICAL DATA: Right hand and right foot numbness.

EXAM:
CT HEAD WITHOUT CONTRAST
TECHNIQUE: Contiguous axial images were obtained from the base of the skull
through the vertex without intravenous contrast.

[Series 2: head wo · axial · 0.39mm/px · z∈[-166,-41]mm · 10 of 31 slices shown, 13 images]
[im 3/31  brain]
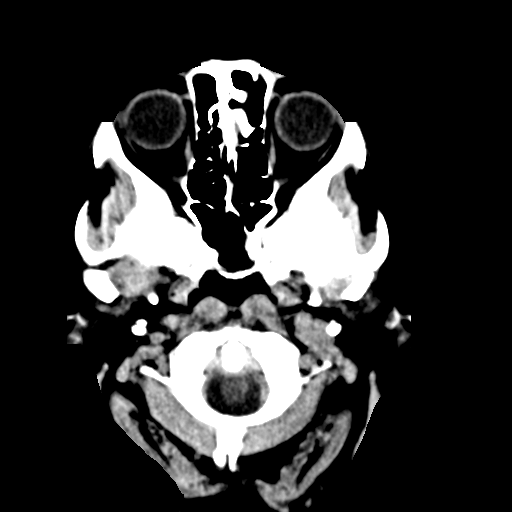
[im 3/31  bone]
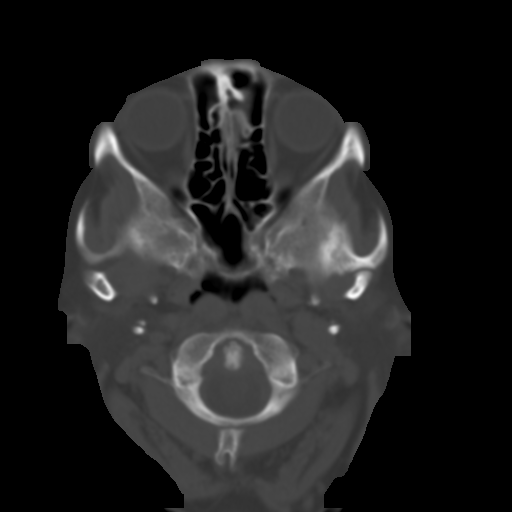
[im 6/31  brain]
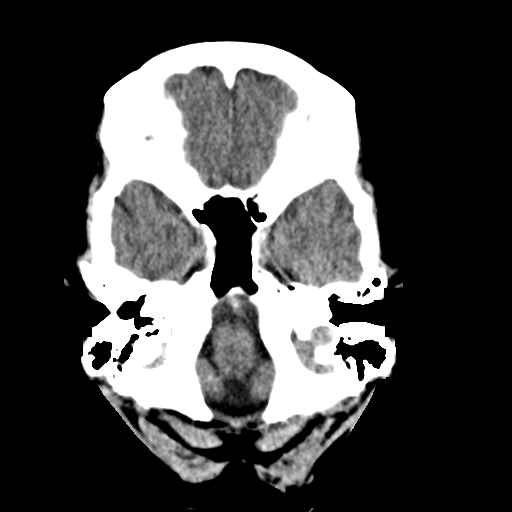
[im 9/31  brain]
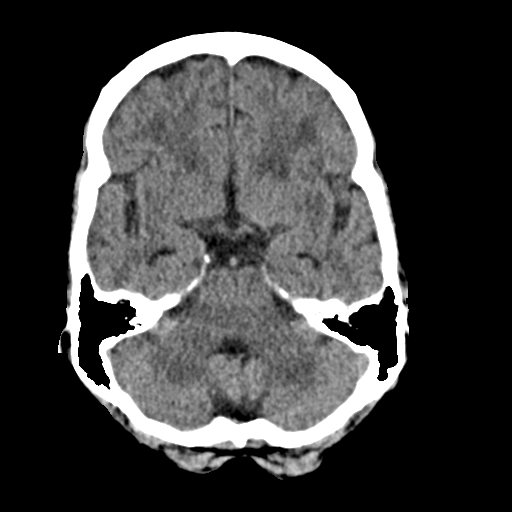
[im 11/31  brain]
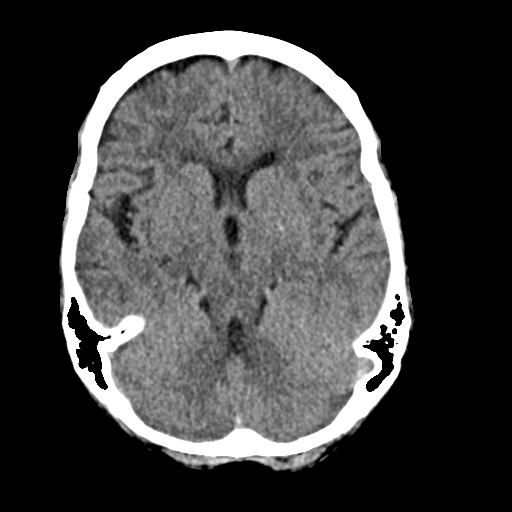
[im 14/31  brain]
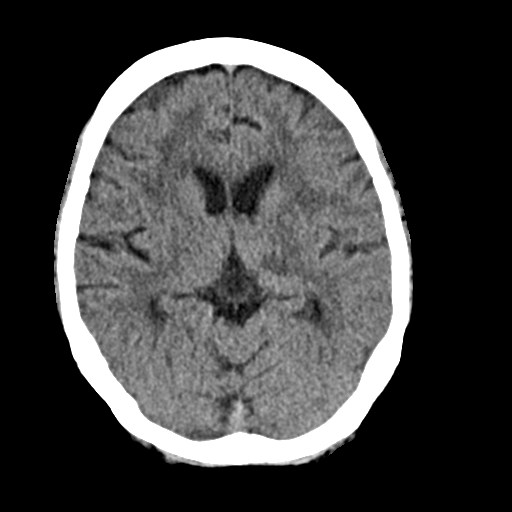
[im 14/31  bone]
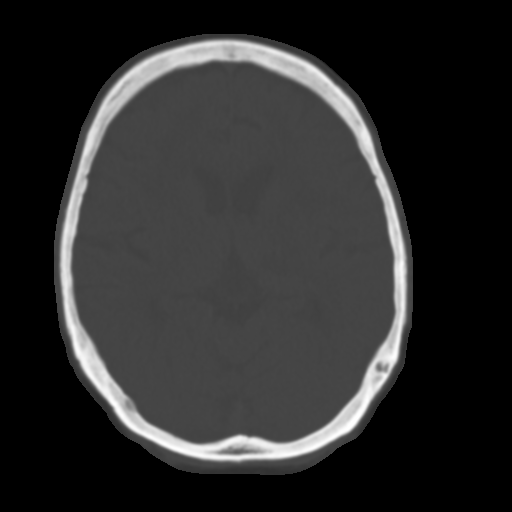
[im 17/31  brain]
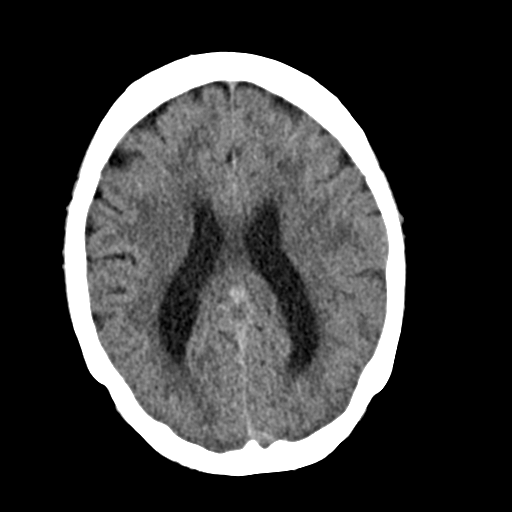
[im 20/31  brain]
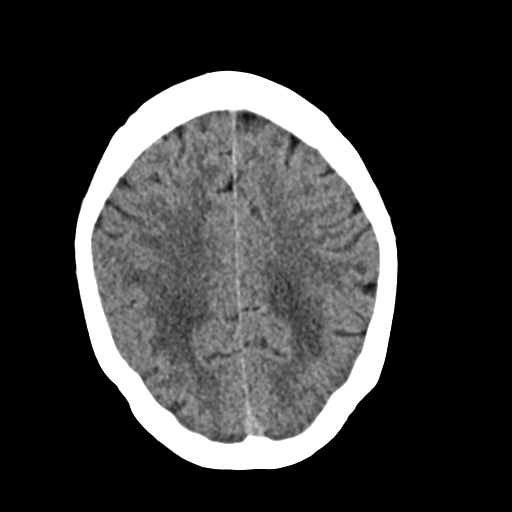
[im 23/31  brain]
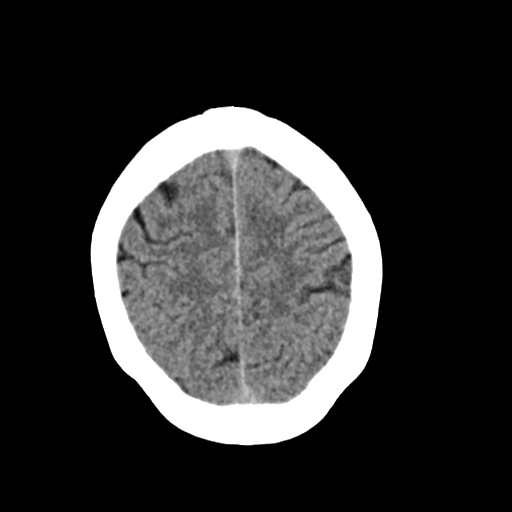
[im 25/31  brain]
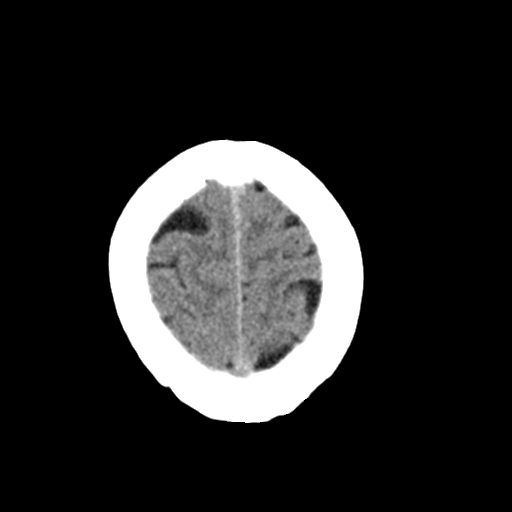
[im 25/31  bone]
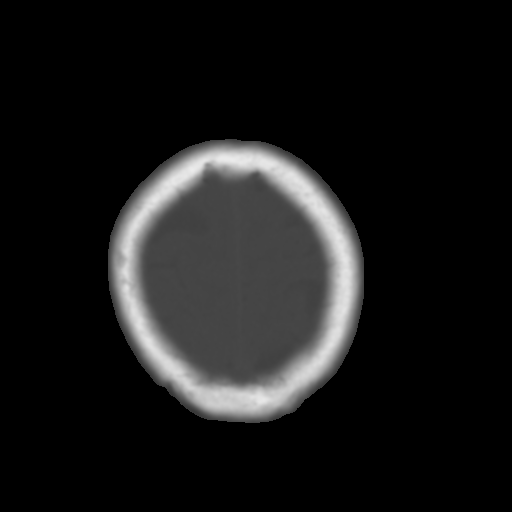
[im 28/31  brain]
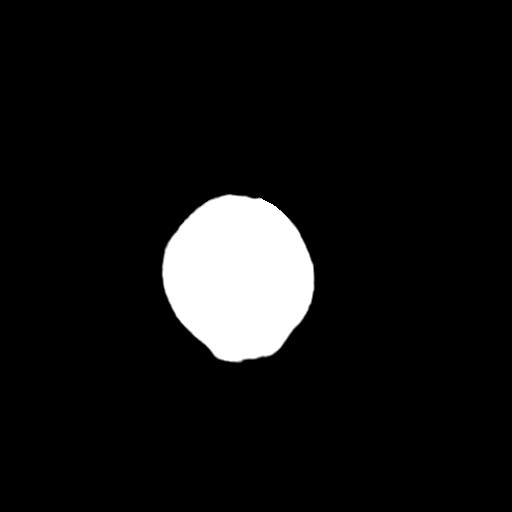

[Series 4: coronal soft · coronal · 0.31mm/px · 3 of 60 slices shown]
[im 20/60  brain]
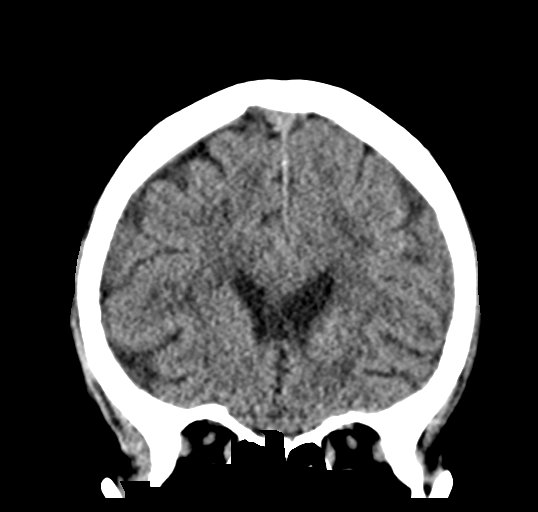
[im 27/60  brain]
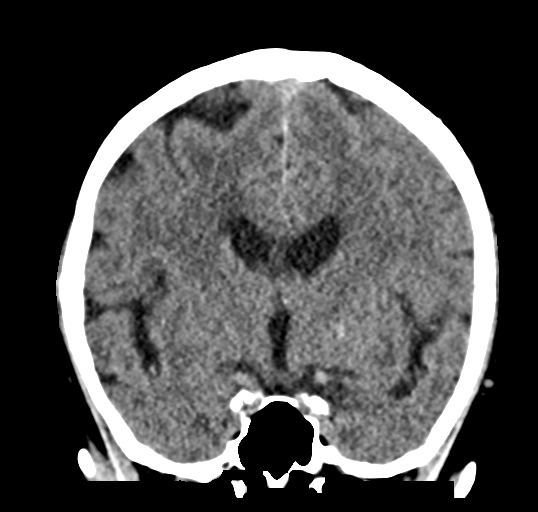
[im 33/60  brain]
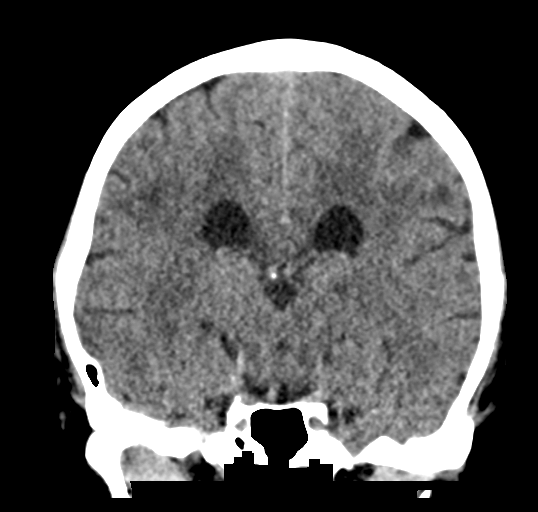

[Series 5: sag soft · sagittal · 0.31mm/px · 3 of 50 slices shown]
[im 17/50  brain]
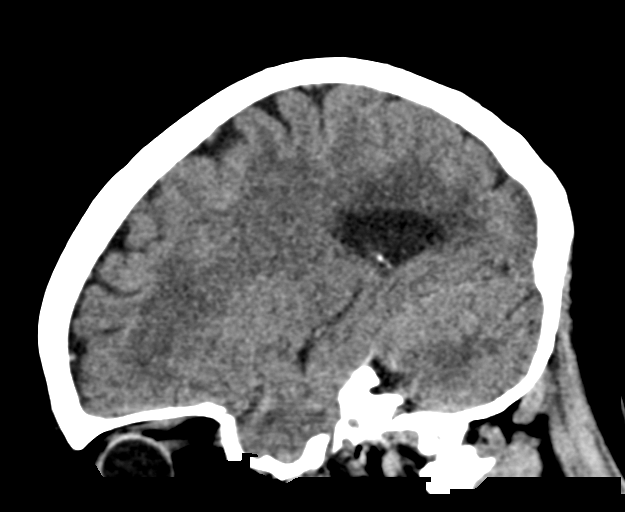
[im 25/50  brain]
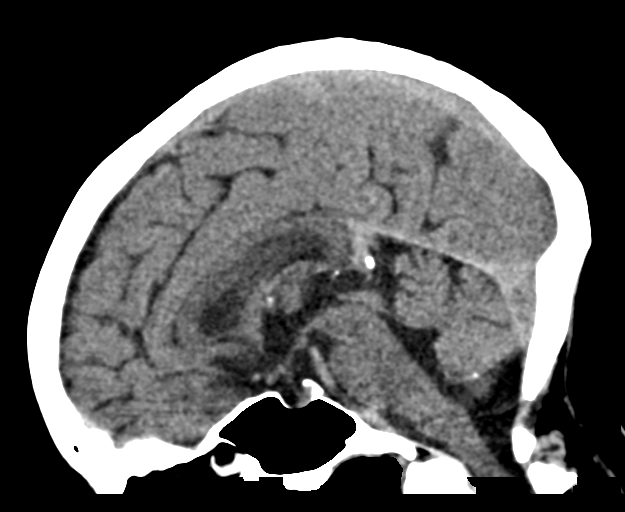
[im 33/50  brain]
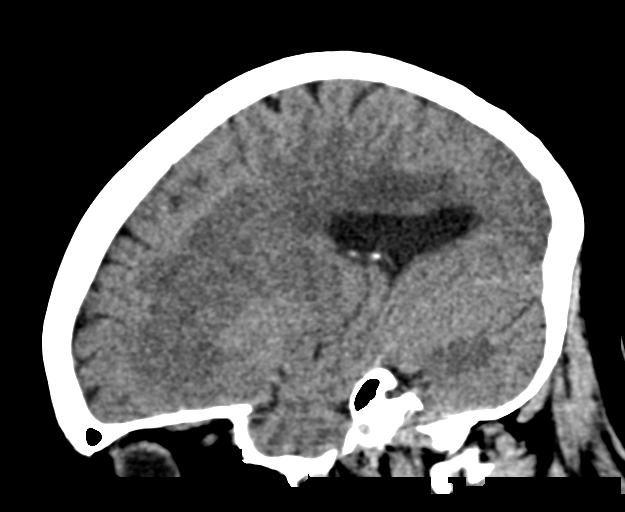

[16 of 47 positions shown; findings below may reference images not displayed]

FINDINGS: Brain: No evidence of acute hemorrhage, hydrocephalus, extra-axial
collection or mass lesion/mass effect. Moderate deep white matter
microangiopathy. Areas of hypoattenuation in the subcortical white
matter of the left frontal lobe slightly more pronounced.

Vascular: No hyperdense vessel or unexpected calcification.

Skull: Normal. Negative for fracture or focal lesion.

Sinuses/Orbits: No acute finding.

Other: None.
IMPRESSION: 1. Moderate deep white matter microangiopathy.
2. Slightly more pronounced patchy areas of hypoattenuation in the
subcortical white matter of the left frontal lobe may represent
asymmetric microangiopathy or potentially age-indeterminate ischemic
infarcts.
3. No evidence of intracranial hemorrhage.
